# Patient Record
Sex: Female | Born: 1980 | Race: Black or African American | Hispanic: No | Marital: Married | Smoking: Never smoker
Health system: Southern US, Community
[De-identification: ages and names within clinical notes are randomized; demographics above are authoritative.]

## PROBLEM LIST (undated history)

## (undated) DIAGNOSIS — I10 Essential (primary) hypertension: Secondary | ICD-10-CM

## (undated) DIAGNOSIS — A6000 Herpesviral infection of urogenital system, unspecified: Secondary | ICD-10-CM

## (undated) DIAGNOSIS — G43909 Migraine, unspecified, not intractable, without status migrainosus: Secondary | ICD-10-CM

## (undated) HISTORY — DX: Migraine, unspecified, not intractable, without status migrainosus: G43.909

---

## 1998-07-16 ENCOUNTER — Emergency Department (HOSPITAL_COMMUNITY): Admission: EM | Admit: 1998-07-16 | Discharge: 1998-07-16 | Payer: Self-pay | Admitting: Emergency Medicine

## 1998-07-16 ENCOUNTER — Encounter: Payer: Self-pay | Admitting: Emergency Medicine

## 2000-09-18 ENCOUNTER — Emergency Department (HOSPITAL_COMMUNITY): Admission: EM | Admit: 2000-09-18 | Discharge: 2000-09-18 | Payer: Self-pay | Admitting: Emergency Medicine

## 2001-03-25 ENCOUNTER — Emergency Department (HOSPITAL_COMMUNITY): Admission: EM | Admit: 2001-03-25 | Discharge: 2001-03-25 | Payer: Self-pay

## 2001-03-27 ENCOUNTER — Emergency Department (HOSPITAL_COMMUNITY): Admission: EM | Admit: 2001-03-27 | Discharge: 2001-03-27 | Payer: Self-pay

## 2001-04-07 ENCOUNTER — Other Ambulatory Visit: Admission: RE | Admit: 2001-04-07 | Discharge: 2001-04-07 | Payer: Self-pay | Admitting: Gynecology

## 2004-06-13 ENCOUNTER — Emergency Department (HOSPITAL_COMMUNITY): Admission: EM | Admit: 2004-06-13 | Discharge: 2004-06-13 | Payer: Self-pay | Admitting: Emergency Medicine

## 2008-10-07 ENCOUNTER — Observation Stay: Payer: Self-pay | Admitting: Obstetrics and Gynecology

## 2008-10-24 ENCOUNTER — Observation Stay: Payer: Self-pay

## 2008-11-30 ENCOUNTER — Inpatient Hospital Stay: Payer: Self-pay

## 2012-11-11 ENCOUNTER — Emergency Department: Payer: Self-pay | Admitting: Emergency Medicine

## 2013-03-19 ENCOUNTER — Ambulatory Visit: Payer: Self-pay | Admitting: Obstetrics and Gynecology

## 2013-03-23 ENCOUNTER — Encounter: Payer: Self-pay | Admitting: *Deleted

## 2013-04-17 ENCOUNTER — Ambulatory Visit (INDEPENDENT_AMBULATORY_CARE_PROVIDER_SITE_OTHER): Payer: Federal, State, Local not specified - PPO | Admitting: General Surgery

## 2013-04-17 ENCOUNTER — Encounter: Payer: Self-pay | Admitting: General Surgery

## 2013-04-17 VITALS — BP 124/76 | HR 80 | Resp 12 | Ht 61.0 in | Wt 169.0 lb

## 2013-04-17 DIAGNOSIS — D1739 Benign lipomatous neoplasm of skin and subcutaneous tissue of other sites: Secondary | ICD-10-CM

## 2013-04-17 DIAGNOSIS — D1720 Benign lipomatous neoplasm of skin and subcutaneous tissue of unspecified limb: Secondary | ICD-10-CM

## 2013-04-17 NOTE — Progress Notes (Signed)
Patient ID: Whitney Carey, female   DOB: 1981-01-19, 32 y.o.   MRN: [id number]  Chief Complaint  Patient presents with  . Other    bilateral axillary masses    HPI Whitney Carey is a 32 y.o. female who presents for an evaluation of bilateral axillary masses. The patient states the one on the right has gotten larger within the last couple of months. It tender to the touch. She has had to change the type of bra she wears she that it doesn't get irritated. No problems with the left side.   HPI  Past Medical History  Diagnosis Date  . Migraine     History reviewed. No pertinent past surgical history.  Family History  Problem Relation Age of Onset  . Adopted: Yes    Social History History  Substance Use Topics  . Smoking status: Former Games developer  . Smokeless tobacco: Not on file  . Alcohol Use: Yes    No Known Allergies  Current Outpatient Prescriptions  Medication Sig Dispense Refill  . aspirin-acetaminophen-caffeine (EXCEDRIN MIGRAINE) 250-250-65 MG per tablet Take 2 tablets by mouth daily as needed for pain.      . naproxen sodium (ANAPROX) 220 MG tablet Take 440 mg by mouth daily as needed.       No current facility-administered medications for this visit.    Review of Systems Review of Systems  Constitutional: Negative.   Respiratory: Negative.   Cardiovascular: Negative.     Blood pressure 124/76, pulse 80, resp. rate 12, height 5\' 1"  (1.549 m), weight 169 lb (76.658 kg).  Physical Exam Physical Exam  Constitutional: She is oriented to person, place, and time. She appears well-developed and well-nourished.  Eyes: Conjunctivae are normal. No scleral icterus.  Neck: No thyromegaly present.  Cardiovascular: Normal rate, regular rhythm and normal heart sounds.   No murmur heard. Pulmonary/Chest: Effort normal and breath sounds normal.    Lymphadenopathy:    She has no cervical adenopathy.  Neurological: She is alert and oriented to person, place, and time.    Skin: Skin is warm and dry.    Data Reviewed  Ultrasound reviewed. No defined mass just subcutaneous tissue that is prominent.   Assessment    Excess skin subcutaneous fold right axilla-possible lipoma     Plan    Discussed surgical excision with patient and patient is agreeable.     Patient's surgery has been scheduled for 04-25-13 at [facility].   Marytza Grandpre G 04/17/2013, 7:17 PM

## 2013-04-17 NOTE — Patient Instructions (Addendum)
Discussed surgical excision with patient and patient is agreeable.    Patient's surgery has been scheduled for 04-25-13 at [facility].

## 2013-04-18 ENCOUNTER — Encounter: Payer: Self-pay | Admitting: *Deleted

## 2013-04-25 ENCOUNTER — Ambulatory Visit: Payer: Self-pay | Admitting: General Surgery

## 2013-04-25 DIAGNOSIS — D213 Benign neoplasm of connective and other soft tissue of thorax: Secondary | ICD-10-CM

## 2013-04-26 LAB — PATHOLOGY REPORT

## 2013-04-27 ENCOUNTER — Telehealth: Payer: Self-pay | Admitting: *Deleted

## 2013-04-27 NOTE — Telephone Encounter (Signed)
Phone call from patient states the Tramadol is making her itch.  Advised to stop taking it and use benadryl as needed. She may use tylenol or advil as needed for pain.

## 2013-04-30 ENCOUNTER — Encounter: Payer: Self-pay | Admitting: General Surgery

## 2013-04-30 ENCOUNTER — Telehealth: Payer: Self-pay

## 2013-04-30 ENCOUNTER — Ambulatory Visit (INDEPENDENT_AMBULATORY_CARE_PROVIDER_SITE_OTHER): Payer: Federal, State, Local not specified - PPO | Admitting: General Surgery

## 2013-04-30 VITALS — BP 120/70 | HR 76 | Resp 12 | Ht 61.0 in | Wt 169.0 lb

## 2013-04-30 DIAGNOSIS — D2360 Other benign neoplasm of skin of unspecified upper limb, including shoulder: Secondary | ICD-10-CM

## 2013-04-30 NOTE — Progress Notes (Deleted)
Patient ID: Whitney Carey, female   DOB: [birth year], 32 y.o.   MRN: [id number]  Chief Complaint  Patient presents with  . Follow-up    HPI Whitney Carey is a 32 y.o. female.  *** HPI  Past Medical History  Diagnosis Date  . Migraine     No past surgical history on file.  Family History  Problem Relation Age of Onset  . Adopted: Yes    Social History History  Substance Use Topics  . Smoking status: Former Games developer  . Smokeless tobacco: Not on file  . Alcohol Use: Yes    No Known Allergies  Current Outpatient Prescriptions  Medication Sig Dispense Refill  . aspirin-acetaminophen-caffeine (EXCEDRIN MIGRAINE) 250-250-65 MG per tablet Take 2 tablets by mouth daily as needed for pain.      . naproxen sodium (ANAPROX) 220 MG tablet Take 440 mg by mouth daily as needed.       No current facility-administered medications for this visit.    Review of Systems Review of Systems  Height 5\' 1"  (1.549 m), weight 169 lb (76.658 kg).  Physical Exam Physical Exam  Data Reviewed ***  Assessment    ***    Plan    ***       Sinda Du 04/30/2013, 10:24 AM

## 2013-04-30 NOTE — Progress Notes (Signed)
Patient ID: Whitney Carey, female   DOB: 10-19-1980, 32 y.o.   MRN: [id number] Patient here due to swelling and pain from removal of mass from the right axilla. The surgery was done on 04/25/13. She states that the area started swelling and becoming painful. She feels that the area is warm to the touch. She states that she has had no fevers, or drainage from the incision site. Exam showed a swelling consistent with seroma. No signs of infection. 75 cc serous fluid drained from site. After sterile prep 1 ml 1% xylocaine used and 18 g needle used to aspirate the seroma.

## 2013-04-30 NOTE — Telephone Encounter (Signed)
Patient called this morning stating that she started experiencing swelling around her incision site yesterday. She states that the swelling is severe and she cannot return to work today. She had a surgical excision preformed 04/25/13. I spoke with Dr Evette Cristal and he said to have her come into the office this morning to be seen. I instructed the patient to come in today to be seen and she agreed.

## 2013-04-30 NOTE — Patient Instructions (Addendum)
Patient to use heating pad.

## 2013-05-01 ENCOUNTER — Telehealth: Payer: Self-pay

## 2013-05-01 ENCOUNTER — Encounter: Payer: Self-pay | Admitting: General Surgery

## 2013-05-01 DIAGNOSIS — IMO0002 Reserved for concepts with insufficient information to code with codable children: Secondary | ICD-10-CM | POA: Insufficient documentation

## 2013-05-01 NOTE — Telephone Encounter (Signed)
Appointment made for am.

## 2013-05-01 NOTE — Telephone Encounter (Signed)
Patient called and states that her under arm has swollen up again and she would like to know what to do.

## 2013-05-02 ENCOUNTER — Encounter: Payer: Self-pay | Admitting: General Surgery

## 2013-05-02 ENCOUNTER — Ambulatory Visit: Payer: Federal, State, Local not specified - PPO | Admitting: General Surgery

## 2013-05-02 VITALS — BP 140/82 | HR 76 | Resp 14 | Ht 61.0 in | Wt 161.0 lb

## 2013-05-02 NOTE — Patient Instructions (Signed)
Patient to keep 05/10/13 appointment.

## 2013-05-02 NOTE — Progress Notes (Signed)
Patient here today for recurrence of seroma drained from rt axilla yesterday.  Swelling is just as big as it was yesterday. With consent a seroma cath was placed. After sterile prep, 3 ml 1%xylocaine mixed with 0.5% marcine was instilled in the inferior aspect. Small skin opening meade and seroma cath  Placed into the cavity.  This was anchored with 4-0 nylon to the skin and dressed. Over 90 ml fluid drained right away. Instructed on care of drain and keeping record of amount of drainage.

## 2013-05-08 ENCOUNTER — Ambulatory Visit: Payer: Federal, State, Local not specified - PPO | Admitting: *Deleted

## 2013-05-08 ENCOUNTER — Telehealth: Payer: Self-pay | Admitting: *Deleted

## 2013-05-08 NOTE — Progress Notes (Signed)
Patient here today for drain check. Dressing removed area clean.  Drain clotted flushed with 5 ml saline per Dr Evette Cristal. Redressed.

## 2013-05-08 NOTE — Telephone Encounter (Signed)
Left message for patient to call the office.   Patient had left a message with the answering service regarding a rash and states tube is loose. She can come in to see the nurse today if she wishes. Patient is presently scheduled to see Dr. Evette Cristal on Wednesday.

## 2013-05-08 NOTE — Patient Instructions (Signed)
May use hydrocortisone cream for local rash right upper arm

## 2013-05-09 ENCOUNTER — Other Ambulatory Visit: Payer: Self-pay | Admitting: *Deleted

## 2013-05-09 ENCOUNTER — Telehealth: Payer: Self-pay | Admitting: *Deleted

## 2013-05-09 MED ORDER — HYDROCODONE-ACETAMINOPHEN 5-325 MG PO TABS: 1.0000 | ORAL_TABLET | ORAL | Status: DC | PRN

## 2013-05-09 NOTE — Telephone Encounter (Signed)
Phone call from patient states she ios having increased pain since yesterday.  The drainage yesterday was 25ml.  She idn't sleep well last night fromt he pain.  "Aching" more. Using tylenol, aleve, and heating pad and its not helping. Informed I would have to talk to Dr Evette Cristal.

## 2013-05-09 NOTE — Progress Notes (Signed)
RX called in to [facility]

## 2013-05-09 NOTE — Telephone Encounter (Signed)
vicodin 5/325mg  1 q4h prn pain #20 per Dr Evette Cristal

## 2013-05-10 ENCOUNTER — Ambulatory Visit: Payer: Federal, State, Local not specified - PPO | Admitting: General Surgery

## 2013-05-10 ENCOUNTER — Encounter: Payer: Self-pay | Admitting: General Surgery

## 2013-05-10 VITALS — BP 138/88 | HR 64 | Resp 12 | Ht 61.0 in | Wt 167.0 lb

## 2013-05-10 MED ORDER — FLUCONAZOLE 150 MG PO TABS: 150.0000 mg | ORAL_TABLET | Freq: Once | ORAL | Status: DC

## 2013-05-10 MED ORDER — SULFAMETHOXAZOLE-TRIMETHOPRIM 800-160 MG PO TABS: 1.0000 | ORAL_TABLET | Freq: Two times a day (BID) | ORAL | Status: DC

## 2013-05-10 NOTE — Progress Notes (Signed)
Patient here today for follow up right drain. Drainage has been minimal over past several days. Pain improving on Vicodin.  13 ml withdrew from drain. Mild induration noted in anterior aspect of excision site.  Will give a weeks course of Septra DS. RTC next week

## 2013-05-15 ENCOUNTER — Encounter: Payer: Self-pay | Admitting: General Surgery

## 2013-05-15 ENCOUNTER — Ambulatory Visit: Payer: Federal, State, Local not specified - PPO | Admitting: General Surgery

## 2013-05-15 VITALS — BP 138/84 | HR 70 | Resp 12 | Ht 61.0 in | Wt 167.0 lb

## 2013-05-15 NOTE — Progress Notes (Signed)
Patient here today for follow up right drain.Patient states she is doing better.  Removed drain and removed 5 cc of fluid.Swelling has markedly improved and induration has subsided.

## 2013-05-15 NOTE — Patient Instructions (Addendum)
Patient to return in 2 week.

## 2013-05-28 ENCOUNTER — Ambulatory Visit: Payer: Federal, State, Local not specified - PPO | Admitting: General Surgery

## 2013-06-28 ENCOUNTER — Encounter: Payer: Self-pay | Admitting: *Deleted

## 2014-06-04 IMAGING — US US EXTREM NON VASC*R* COMPLETE
1 series · 14 of 25 positions shown · non-contrast
Comparison: none

REASON FOR EXAM: fatty tumor rt axilla
COMMENTS:

[Series 1: us extrem non vasc*right* complete · 0.08mm/px · 14 of 29 slices shown]
[im 1/29]
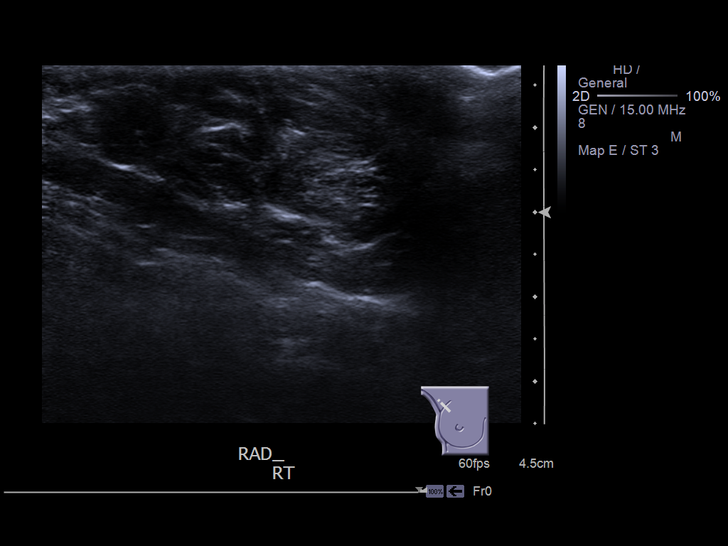
[im 3/29]
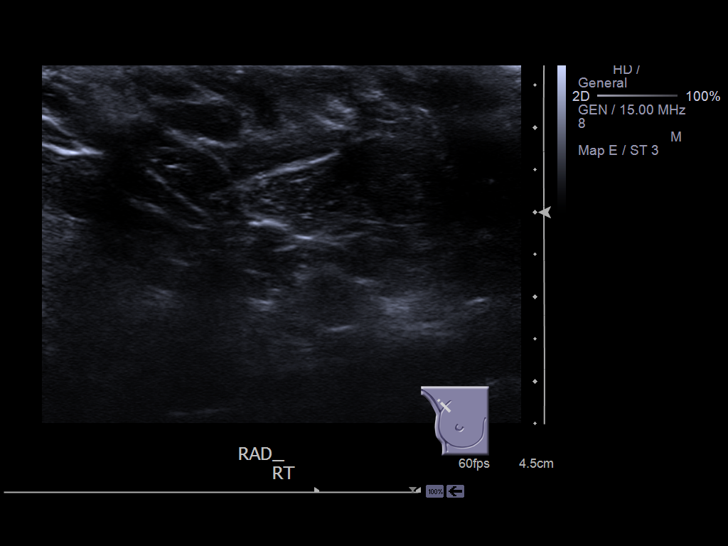
[im 5/29]
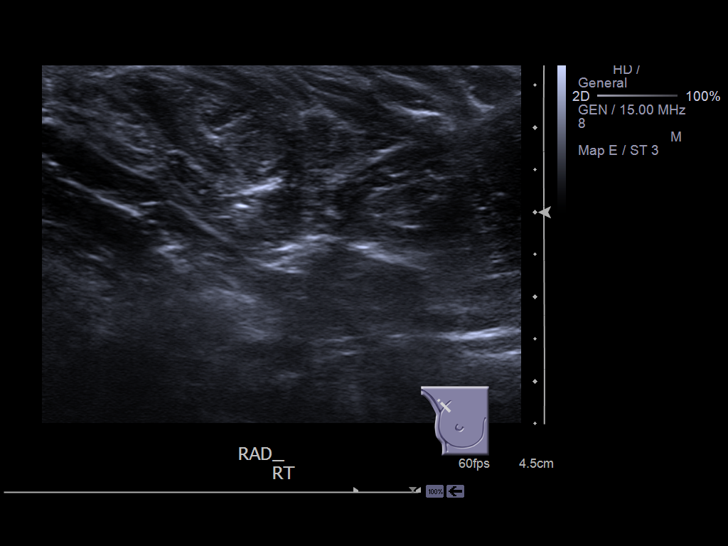
[im 8/29]
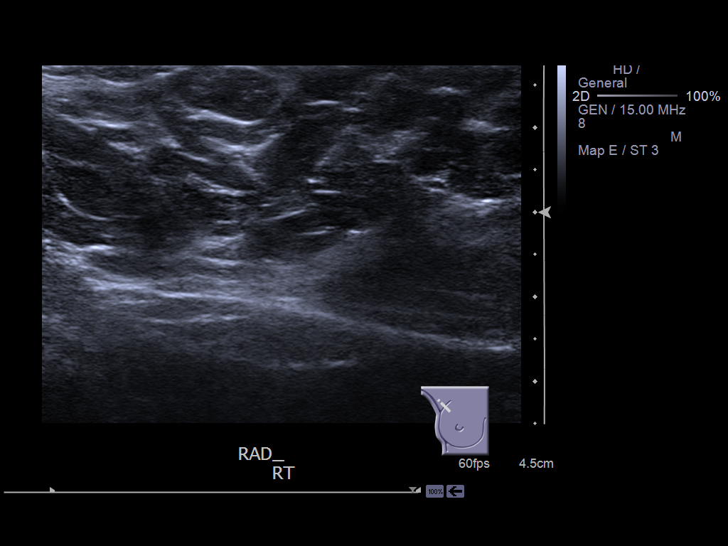
[im 10/29]
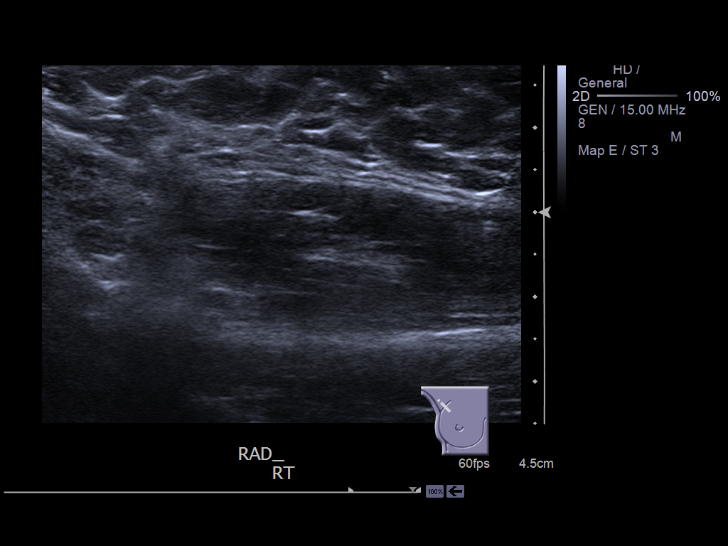
[im 11/29]
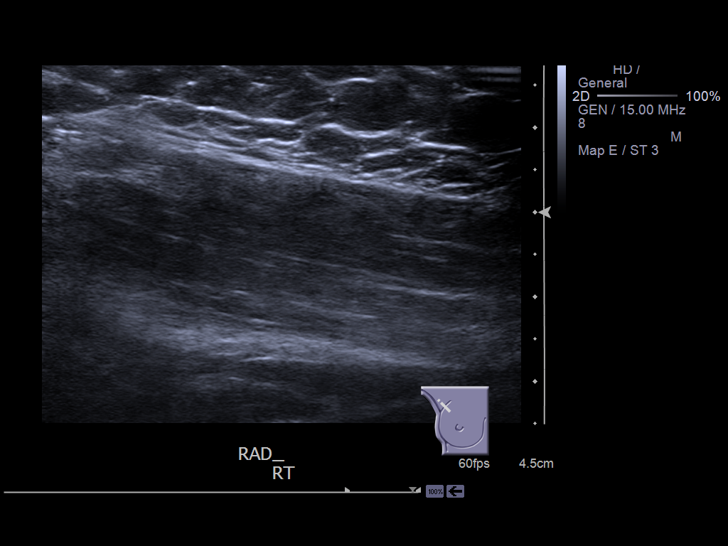
[im 13/29]
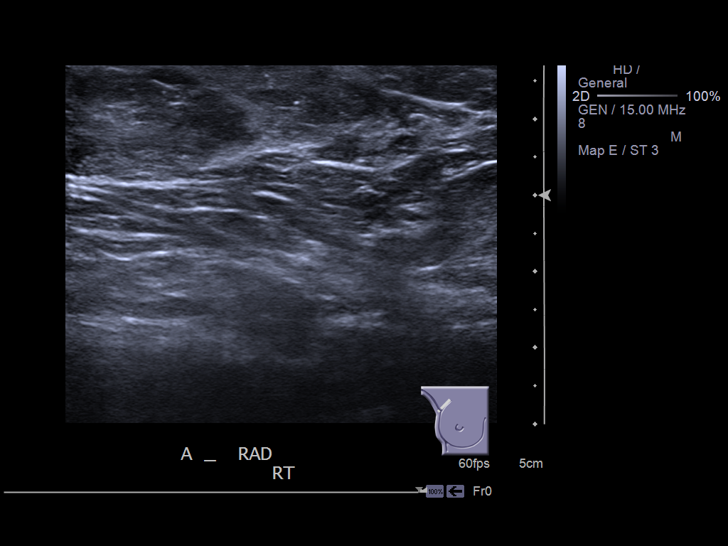
[im 16/29]
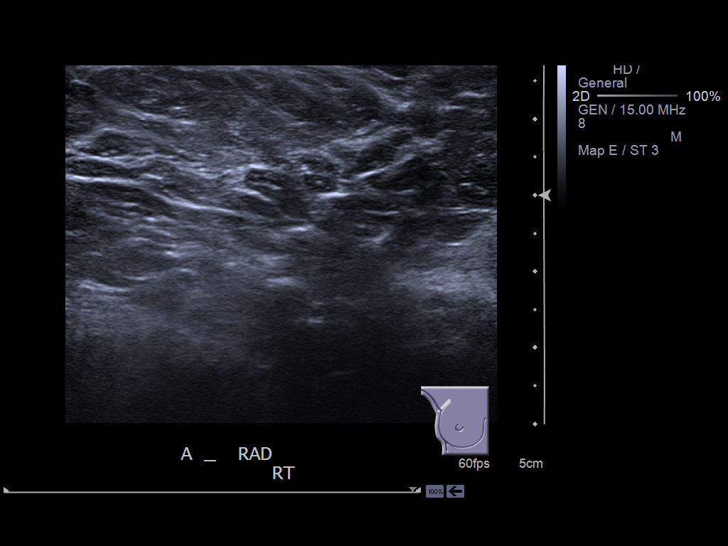
[im 18/29]
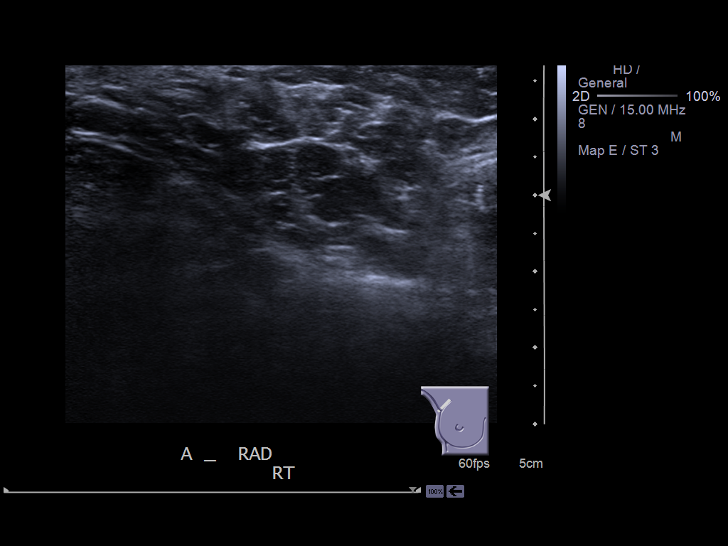
[im 19/29]
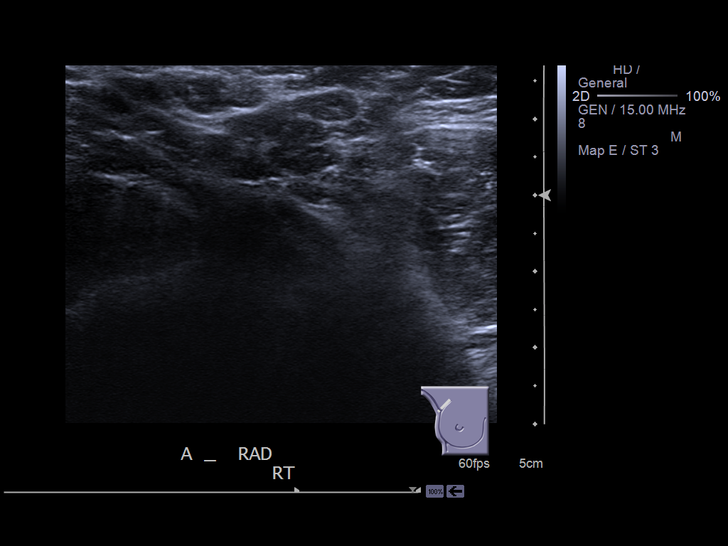
[im 22/29]
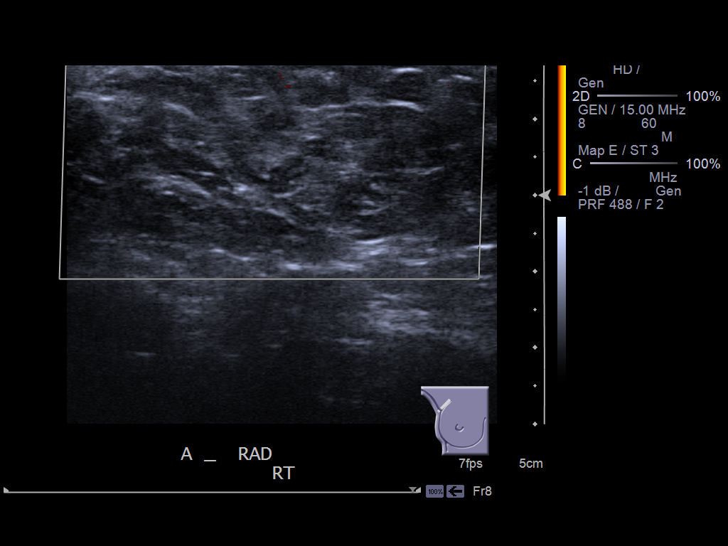
[im 24/29]
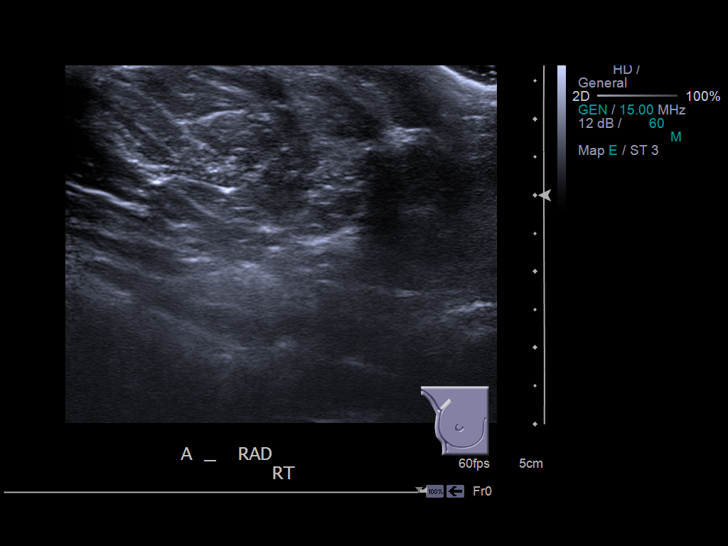
[im 26/29]
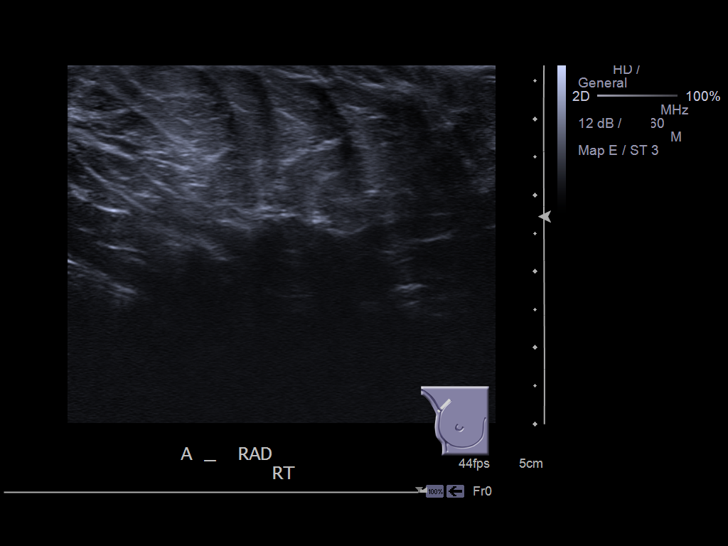
[im 29/29]
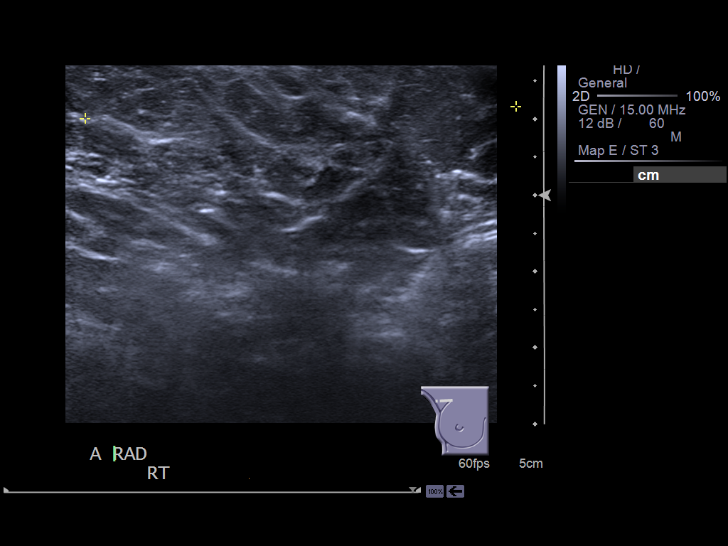

[14 of 25 positions shown; findings below may reference images not displayed]

PROCEDURE:     US  - US NON-VASCULAR EXTREMITY RT  - March 19, 2013 [DATE]

RESULT:     Dedicated ultrasound of the right axilla is performed. Some
comparison images were obtained through the left axilla. There is a similar
appearance of the echotexture and architecture sonographically in the
axillary regions. The right axillary appearance shows this area tissue is
somewhat thicker than on the left axilla. A well-circumscribed or
encapsulated mass is not appreciated. The possibility of a lipoma in a fatty
region such as the axilla would be difficult to completely exclude.
IMPRESSION: Slight thickening of the tissue the right axillary region
compare to the left without a discernible mass. Please see above.

[REDACTED]

## 2014-07-15 ENCOUNTER — Encounter: Payer: Self-pay | Admitting: General Surgery

## 2015-01-03 NOTE — Op Note (Signed)
PATIENT NAME:  Whitney Carey, Whitney Carey MR#:  [id number] DATE OF BIRTH:  Apr 09, 1981  DATE OF PROCEDURE:  04/25/2013  PREOPERATIVE DIAGNOSIS: Mass in the right axilla.   POSTOPERATIVE DIAGNOSIS: Mass in the right axilla, likely a lipoma.    OPERATION PERFORMED: Excision of soft tissue mass in the right axilla.   SURGEON: S.G. Jamal Collin, M.D.   ANESTHESIA: General.   COMPLICATIONS: None.   ESTIMATED BLOOD LOSS: Minimal.   DRAINS: None.   DESCRIPTION OF PROCEDURE: The patient was put to sleep with LMA, and then the right axilla was prepped and draped out as a sterile field. Timeout procedure was performed. Located in the lower medial portion of the axilla just below the anterior fold was a soft mass approximately 6 to 7 cm in size. It did not have any defined borders on palpation. A transverse incision over this was planned, and 0.5% Marcaine totaling 10 mL was instilled. An incision was then made in the subcutaneous tissue. A lipomatous mass was encountered which was then circumferentially freed and excised out using cautery for control of bleeding. After this was successfully removed, the area was inspected and after ensuring hemostasis was irrigated and an additional 5 mL of Marcaine was placed in the cavity for postop analgesia. Subcutaneous tissue was closed with interrupted 2-0 Vicryl stitches, and the skin was closed with subcuticular 4-0 Vicryl, covered with Dermabond. The procedure was well tolerated, and the patient subsequently was extubated and returned to the recovery room in stable condition.   ____________________________ S.Robinette Haines, MD sgs:gb D: 04/25/2013 17:12:57 ET T: 04/25/2013 23:44:34 ET JOB#: [id number]  cc: Synthia Innocent. Jamal Collin, MD, <Dictator> [facility] Robinette Haines MD ELECTRONICALLY SIGNED 04/26/2013 6:56

## 2016-10-08 ENCOUNTER — Emergency Department
Admission: EM | Admit: 2016-10-08 | Discharge: 2016-10-08 | Disposition: A | Discharge status: Home / Self Care | Payer: Federal, State, Local not specified - PPO | Attending: Emergency Medicine | Admitting: Emergency Medicine

## 2016-10-08 ENCOUNTER — Encounter: Payer: Self-pay | Admitting: Emergency Medicine

## 2016-10-08 DIAGNOSIS — R0981 Nasal congestion: Secondary | ICD-10-CM | POA: Diagnosis not present

## 2016-10-08 DIAGNOSIS — Z791 Long term (current) use of non-steroidal anti-inflammatories (NSAID): Secondary | ICD-10-CM | POA: Insufficient documentation

## 2016-10-08 DIAGNOSIS — R05 Cough: Secondary | ICD-10-CM | POA: Insufficient documentation

## 2016-10-08 DIAGNOSIS — Z7982 Long term (current) use of aspirin: Secondary | ICD-10-CM | POA: Diagnosis not present

## 2016-10-08 DIAGNOSIS — Z79899 Other long term (current) drug therapy: Secondary | ICD-10-CM | POA: Insufficient documentation

## 2016-10-08 DIAGNOSIS — R509 Fever, unspecified: Secondary | ICD-10-CM | POA: Diagnosis present

## 2016-10-08 DIAGNOSIS — R52 Pain, unspecified: Secondary | ICD-10-CM | POA: Diagnosis not present

## 2016-10-08 DIAGNOSIS — I10 Essential (primary) hypertension: Secondary | ICD-10-CM | POA: Diagnosis not present

## 2016-10-08 DIAGNOSIS — R69 Illness, unspecified: Secondary | ICD-10-CM

## 2016-10-08 DIAGNOSIS — J111 Influenza due to unidentified influenza virus with other respiratory manifestations: Secondary | ICD-10-CM

## 2016-10-08 HISTORY — DX: Essential (primary) hypertension: I10

## 2016-10-08 LAB — INFLUENZA PANEL BY PCR (TYPE A & B)
Influenza A By PCR: NEGATIVE
Influenza B By PCR: NEGATIVE

## 2016-10-08 MED ORDER — PSEUDOEPH-BROMPHEN-DM 30-2-10 MG/5ML PO SYRP: 5.0000 mL | ORAL_SOLUTION | Freq: Four times a day (QID) | ORAL | 0 refills | Status: DC | PRN

## 2016-10-08 MED ORDER — FLUTICASONE PROPIONATE 50 MCG/ACT NA SUSP: 2.0000 | Freq: Every day | NASAL | 0 refills | Status: DC

## 2016-10-08 MED ORDER — ACETAMINOPHEN-CODEINE #3 300-30 MG PO TABS: 1.0000 | ORAL_TABLET | Freq: Three times a day (TID) | ORAL | 0 refills | Status: DC | PRN

## 2016-10-08 MED ORDER — BENZONATATE 100 MG PO CAPS: 100.0000 mg | ORAL_CAPSULE | Freq: Three times a day (TID) | ORAL | 0 refills | Status: DC | PRN

## 2016-10-08 NOTE — ED Provider Notes (Signed)
[facility] Emergency Department Provider Note ____________________________________________  Time seen: 2213  I have reviewed the triage vital signs and the nursing notes.  HISTORY  Chief Complaint  Influenza  HPI Whitney Carey is a 36 y.o. female presents to the ED with a 3 day complaint of flu-like symptoms that include fevers, sweats, cough, congestion, and bodyaches. She has been dosing over-the-counter TheraFlu for symptom relief. She did not receive the seasonal flu vaccine.  Past Medical History:  Diagnosis Date  . Hypertension   . Migraine     Patient Active Problem List   Diagnosis Date Noted  . Seroma complicating a procedure 05/01/2013  . 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 No pertinent surgical history.  Prior to Admission medications   Medication Sig Start Date End Date Taking? Authorizing Provider  acetaminophen-codeine (TYLENOL #3) 300-30 MG tablet Take 1 tablet by mouth every 8 (eight) hours as needed for moderate pain. 10/08/16   Talibah Colasurdo V Bacon Vashaun Osmon, PA-C  aspirin-acetaminophen-caffeine (EXCEDRIN MIGRAINE) [phone] MG per tablet Take 2 tablets by mouth daily as needed for pain.    Historical Provider, MD  benzonatate (TESSALON PERLES) 100 MG capsule Take 1 capsule (100 mg total) by mouth 3 (three) times daily as needed for cough (Take 1-2 per dose). 10/08/16   Lon Klippel V Bacon Cylas Falzone, PA-C  brompheniramine-pseudoephedrine-DM 30-2-10 MG/5ML syrup Take 5 mLs by mouth 4 (four) times daily as needed. 10/08/16   Lemar Bakos V Bacon Brennan Litzinger, PA-C  fluconazole (DIFLUCAN) 150 MG tablet Take 1 tablet (150 mg total) by mouth once. 05/10/13   Seeplaputhur Robinette Haines, MD  fluticasone (FLONASE) 50 MCG/ACT nasal spray Place 2 sprays into both nostrils daily. 10/08/16   Coralie Stanke V Bacon Nichael Ehly, PA-C  HYDROcodone-acetaminophen (NORCO/VICODIN) 5-325 MG per tablet Take 1 tablet by mouth every 4 (four) hours as needed for pain. 05/09/13   Seeplaputhur Robinette Haines, MD  naproxen sodium (ANAPROX) 220 MG tablet Take 440 mg by mouth daily as needed.    Historical Provider, MD  sulfamethoxazole-trimethoprim (BACTRIM DS,SEPTRA DS) 800-160 MG per tablet Take 1 tablet by mouth 2 (two) times daily. 05/10/13   Seeplaputhur Robinette Haines, MD    Allergies Tramadol  Family History  Problem Relation Age of Onset  . Adopted: Yes    Social History Social History  Substance Use Topics  . Smoking status: Never Smoker  . Smokeless tobacco: Never Used  . Alcohol use Yes    Review of Systems  Constitutional: Positive for fever. Eyes: Negative for visual changes. ENT: Negative for sore throat. Cardiovascular: Negative for chest pain. Respiratory: Negative for shortness of breath. Gastrointestinal: Negative for abdominal pain, vomiting and diarrhea. Musculoskeletal: Negative for back pain. Reports general bodyaches Skin: Negative for rash. Neurological: Negative for headaches, focal weakness or numbness. ____________________________________________  PHYSICAL EXAM:  VITAL SIGNS: ED Triage Vitals  Enc Vitals Group     BP 10/08/16 2116 (!) 170/100     Pulse Rate 10/08/16 2116 80     Resp 10/08/16 2116 16     Temp 10/08/16 2116 98.2 F (36.8 C)     Temp Source 10/08/16 2116 Oral     SpO2 10/08/16 2116 95 %     Weight 10/08/16 2118 165 lb (74.8 kg)     Height 10/08/16 2118 5\' 2"  (1.575 m)     Head Circumference --      Peak Flow --      Pain Score 10/08/16 2118 5     Pain  Loc --      Pain Edu? --      Excl. in [place]? --    Constitutional: Alert and oriented. Well appearing and in no distress. Head: Normocephalic and atraumatic. Eyes: Conjunctivae are normal. PERRL. Normal extraocular movements Ears: Canals clear. TMs intact bilaterally. Nose: No congestion/rhinorrhea/epistaxis. Mouth/Throat: Mucous membranes are moist. Cardiovascular: Normal rate, regular rhythm. Normal distal pulses. Respiratory: Normal respiratory effort. No  wheezes/rales/rhonchi. Musculoskeletal: Nontender with normal range of motion in all extremities.  Neurologic:  Normal gait without ataxia. Normal speech and language. No gross focal neurologic deficits are appreciated. Skin:  Skin is warm, dry and intact. No rash noted. ____________________________________________   LABS (pertinent positives/negatives) Labs Reviewed  INFLUENZA PANEL BY PCR (TYPE A & B)  ____________________________________________  INITIAL IMPRESSION / ASSESSMENT AND PLAN / ED COURSE  Patient with symptoms consistent with influenza despite a negative influenza test. She is discharged with prescriptions for Tessalon Perles, Flonase, Bromfed DM syrup, and Tylenol #3 dosed as directed. He is encouraged to use over-the-counter Delsym cough syrup and a monitor and treat fevers as appropriate. Work note is provided for 1-2 days as needed. ____________________________________________  FINAL CLINICAL IMPRESSION(S) / ED DIAGNOSES  Final diagnoses:  Influenza-like illness      Melvenia Needles, PA-C 10/08/16 [place], MD 10/08/16 2316

## 2016-10-08 NOTE — ED Triage Notes (Signed)
Pt states that x3 days she has been feeling generalized body aches and diarrhea x3  Today. Pt states that she has been febrile during this time. Pt is ambulatory to triage with NAD at this time.

## 2016-10-08 NOTE — Discharge Instructions (Signed)
Your symptoms are consistent with the flu despite your negative Flu test. Take the prescription meds as directed. Consider using a room humidifier overnight. Start OTC Delsym (or store brand equivalent) for cough relief. Follow-up with your provider or [facility] as needed.

## 2016-10-08 NOTE — ED Notes (Signed)
Patient c/o chills, generalized body aches, diarrhea, anorexia, sinus congestion beginning 1/24. Patient denies N/V, cough, SOB

## 2016-11-26 ENCOUNTER — Encounter: Payer: Self-pay | Admitting: Emergency Medicine

## 2016-11-26 ENCOUNTER — Emergency Department
Admission: EM | Admit: 2016-11-26 | Discharge: 2016-11-26 | Disposition: A | Discharge status: Home / Self Care | Payer: Federal, State, Local not specified - PPO | Attending: Emergency Medicine | Admitting: Emergency Medicine

## 2016-11-26 DIAGNOSIS — Y9389 Activity, other specified: Secondary | ICD-10-CM | POA: Diagnosis not present

## 2016-11-26 DIAGNOSIS — I10 Essential (primary) hypertension: Secondary | ICD-10-CM | POA: Insufficient documentation

## 2016-11-26 DIAGNOSIS — Z79899 Other long term (current) drug therapy: Secondary | ICD-10-CM | POA: Insufficient documentation

## 2016-11-26 DIAGNOSIS — S29019A Strain of muscle and tendon of unspecified wall of thorax, initial encounter: Secondary | ICD-10-CM | POA: Diagnosis not present

## 2016-11-26 DIAGNOSIS — S199XXA Unspecified injury of neck, initial encounter: Secondary | ICD-10-CM | POA: Diagnosis present

## 2016-11-26 DIAGNOSIS — Y999 Unspecified external cause status: Secondary | ICD-10-CM | POA: Insufficient documentation

## 2016-11-26 DIAGNOSIS — Y92410 Unspecified street and highway as the place of occurrence of the external cause: Secondary | ICD-10-CM | POA: Diagnosis not present

## 2016-11-26 DIAGNOSIS — S161XXA Strain of muscle, fascia and tendon at neck level, initial encounter: Secondary | ICD-10-CM | POA: Insufficient documentation

## 2016-11-26 DIAGNOSIS — M7918 Myalgia, other site: Secondary | ICD-10-CM

## 2016-11-26 MED ORDER — IBUPROFEN 600 MG PO TABS: 600.0000 mg | ORAL_TABLET | Freq: Three times a day (TID) | ORAL | 0 refills | Status: DC | PRN

## 2016-11-26 MED ORDER — OXYCODONE-ACETAMINOPHEN 5-325 MG PO TABS: 1.0000 | ORAL_TABLET | Freq: Four times a day (QID) | ORAL | 0 refills | Status: DC | PRN

## 2016-11-26 MED ORDER — CYCLOBENZAPRINE HCL 10 MG PO TABS: 10.0000 mg | ORAL_TABLET | Freq: Three times a day (TID) | ORAL | 0 refills | Status: DC | PRN
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
## 2019-07-15 ENCOUNTER — Ambulatory Visit
Admission: EM | Admit: 2019-07-15 | Discharge: 2019-07-15 | Disposition: A | Discharge status: Home / Self Care | Payer: Federal, State, Local not specified - PPO

## 2019-07-15 ENCOUNTER — Encounter: Payer: Self-pay | Admitting: Emergency Medicine

## 2019-07-15 ENCOUNTER — Other Ambulatory Visit: Payer: Self-pay

## 2019-07-15 DIAGNOSIS — Z20828 Contact with and (suspected) exposure to other viral communicable diseases: Secondary | ICD-10-CM

## 2019-07-15 DIAGNOSIS — J069 Acute upper respiratory infection, unspecified: Secondary | ICD-10-CM

## 2019-07-15 DIAGNOSIS — H66002 Acute suppurative otitis media without spontaneous rupture of ear drum, left ear: Secondary | ICD-10-CM | POA: Diagnosis not present

## 2019-07-15 DIAGNOSIS — Z3A36 36 weeks gestation of pregnancy: Secondary | ICD-10-CM

## 2019-07-15 DIAGNOSIS — Z20822 Contact with and (suspected) exposure to covid-19: Secondary | ICD-10-CM

## 2019-07-15 HISTORY — DX: Herpesviral infection of urogenital system, unspecified: A60.00

## 2019-07-15 MED ORDER — AMOXICILLIN 875 MG PO TABS: 875.0000 mg | ORAL_TABLET | Freq: Two times a day (BID) | ORAL | 0 refills | Status: AC

## 2019-07-15 NOTE — ED Provider Notes (Signed)
[place], [place]   Name: Whitney Carey DOB: [birth year] MRN: [id number] CSN: [id number] PCP: System, Pcp Not In  Arrival date and time:  07/15/19 1158  Chief Complaint:  Cough and Nasal Congestion   NOTE: Prior to seeing the patient today, I have reviewed the triage nursing documentation and vital signs. Clinical staff has updated patient's PMH/PSHx, current medication list, and drug allergies/intolerances to ensure comprehensive history available to assist in medical decision making.   History:   HPI: Whitney Carey is a 38 y.o. female who presents today with complaints of cough, congestion, headache, sore throat, and BILATERAL ear pain that started with acute onset on Friday. She endorses subjective fevers. Cough has been productive of thick clear/white sputum. PMH (+) for seasonal allergies, however patient notes that her current symptoms are worse than any allergy symptoms that she has ever experienced. She denies that she has experienced any nausea, vomiting, diarrhea, or abdominal pain. She is eating and drinking well. Patient denies any perceived alterations to her sense of taste or smell. She notes that her children were sick with similar symptoms last week; resolved after a couple of days without treatment. Patient denies being in close contact with anyone else known to be ill. She has never been tested for SARS-CoV-2 (novel coronavirus) per her report. She presents today with concerns about possible SARS-CoV-2 infection as she is currently [redacted] weeks pregnant. She is a high risk (advance maternal age, essential HTN, gestational diabetes) G4P3 who followed at [facility]. Next OB/GYN visit is on Tuesday (06/16/2019). Despite her symptoms, patient has not taken any over the counter interventions to help improve/relieve her reported symptoms at home due to her pregnancy status. She has been drinking hot tea with honey and lemon, which has helped "some".  Past Medical History:  Diagnosis Date  . Genital  herpes simplex   . Hypertension   . Migraine     History reviewed. No pertinent surgical history.  Family History  Adopted: Yes  Problem Relation Age of Onset  . Healthy Mother     Social History   Tobacco Use  . Smoking status: Never Smoker  . Smokeless tobacco: Never Used  Substance Use Topics  . Alcohol use: Yes  . Drug use: No    Patient Active Problem List   Diagnosis Date Noted  . Seroma complicating a procedure 05/01/2013  . Lipoma of axilla 04/17/2013    Home Medications:    Current Meds  Medication Sig  . Prenatal Vit-Fe Fumarate-FA (PNV PRENATAL PLUS MULTIVITAMIN) 27-1 MG TABS Take by mouth.    Allergies:   Tramadol  Review of Systems (ROS): Review of Systems  Constitutional: Positive for fever (subjective). Negative for fatigue.       36 week G4P3 followed at [facility].  HENT: Positive for congestion, ear pain and sore throat. Negative for postnasal drip, rhinorrhea, sinus pressure, sinus pain and sneezing.   Eyes: Negative for pain, discharge and redness.  Respiratory: Positive for cough. Negative for chest tightness and shortness of breath.   Cardiovascular: Negative for chest pain and palpitations.  Gastrointestinal: Negative for abdominal pain, diarrhea, nausea and vomiting.  Endocrine:       Gestational diabetes  Musculoskeletal: Negative for arthralgias, back pain, myalgias and neck pain.  Skin: Negative for color change, pallor and rash.  Allergic/Immunologic: Positive for environmental allergies (seasonal).  Neurological: Positive for headaches. Negative for dizziness, syncope and weakness.  Hematological: Negative for adenopathy.     Vital Signs: Today's Vitals   07/15/19  1212 07/15/19 1216 07/15/19 1248  BP:  (!) 147/99   Pulse:  97   Resp:  14   Temp:  98.9 F (37.2 C)   TempSrc:  Oral   SpO2:  97%   Weight: 187 lb (84.8 kg)    Height: 5\' 2"  (1.575 m)    PainSc: 0-No pain  0-No pain    Physical Exam: Physical Exam   Constitutional: She is oriented to person, place, and time and well-developed, well-nourished, and in no distress. She appears not dehydrated.  Non-toxic appearance. No distress.  HENT:  Head: Normocephalic and atraumatic.  Right Ear: Tympanic membrane normal.  Left Ear: There is tenderness. Tympanic membrane is erythematous and bulging. A middle ear effusion (suppurative) is present.  Nose: Mucosal edema and rhinorrhea present. No sinus tenderness.  Mouth/Throat: Uvula is midline and mucous membranes are normal. Posterior oropharyngeal erythema (+) clear PND present.  Eyes: Pupils are equal, round, and reactive to light. Conjunctivae and EOM are normal.  Neck: Normal range of motion and full passive range of motion without pain. Neck supple.  Cardiovascular: Normal rate, regular rhythm, normal heart sounds and intact distal pulses. Exam reveals no gallop and no friction rub.  No murmur heard. Pulmonary/Chest: Effort normal. No respiratory distress. She has no decreased breath sounds. She has no wheezes. She has rhonchi (upper airways; clears completely with cough). She has no rales.  Abdominal: Soft. Normal appearance and bowel sounds are normal. She exhibits no distension. There is no abdominal tenderness.  (+) gravid abdomen; no pain. (+) fetal movement appreciated by patient; activity at baseline. FHTs 150.   Musculoskeletal: Normal range of motion.  Lymphadenopathy:       Head (left side): Submandibular adenopathy present.  Neurological: She is alert and oriented to person, place, and time. Gait normal.  Skin: Skin is warm and dry. No rash noted. She is not diaphoretic.  Psychiatric: Mood, memory, affect and judgment normal.  Nursing note and vitals reviewed.   Urgent Care Treatments / Results:   LABS: PLEASE NOTE: all labs that were ordered this encounter are listed, however only abnormal results are displayed. Labs Reviewed  NOVEL CORONAVIRUS, NAA (HOSP ORDER, SEND-OUT TO REF LAB;  TAT 18-24 HRS)    EKG: -None  RADIOLOGY: No results found.  PROCEDURES: Procedures  MEDICATIONS RECEIVED THIS VISIT: Medications - No data to display  PERTINENT CLINICAL COURSE NOTES/UPDATES:   Initial Impression / Assessment and Plan / Urgent Care Course:  Pertinent labs & imaging results that were available during my care of the patient were personally reviewed by me and considered in my medical decision making (see lab/imaging section of note for values and interpretations).  Whitney Carey is a 38 y.o. female who presents to [facility] today with complaints of Cough and Nasal Congestion   Patient overall well appearing and in no acute distress today in clinic. Presenting symptoms (see HPI) and exam as documented above. She presents with symptoms associated with SARS-CoV-2 (novel coronavirus). Discussed typical symptom constellation. Reviewed potential for infection and need for testing. Patient amenable to being tested. SARS-CoV-2 swab collected by certified clinical staff. Discussed variable turn around times associated with testing, as swabs are being processed at [facility], and have been taking between 2-5 days to come back. She was advised to self quarantine, per [facility] DHHS guidelines, until negative results received.   Patient [redacted] weeks pregnant and has not taken any medications for her symptoms. She denies abdominal pain and confirms (+) fetal movement;  FHTs 150. LEFT ear appears infected. She has concurrent cough and congestion.  Until ruled out with confirmatory lab testing, SARS-CoV-2 remains part of the differential. Her testing is pending at this time.    Will proceed with treatment for AOME in the LEFT ear with a 7 day course of amoxicillin.   Discussed supportive care measures at home during acute phase of illness. Patient to rest as much as possible. She was encouraged to ensure adequate hydration (water and ORS) to prevent dehydration and electrolyte derangements.  Patient may use APAP on an as needed basis for pain/fever.    She is having concurrent URI symptoms. Patient advised that she is safe to use Coricidin HBP products and Delsym if really needed for severe symptoms, however encouraged her avoid overuse. Specifically cautioned her with regards to use of any decongestants. She was advised to avoid decongestants as they can all cause dangerous elevations in her blood pressure.    Patient is scheduled to see OB/GYN on 07/17/2019 for routine follow up visit. She was encouraged to solicit further recommendations from them regarding symptomatic relief if not improving.   I have reviewed the follow up and strict return precautions for any new or worsening symptoms. Patient is aware of symptoms that would be deemed urgent/emergent, and would thus require further evaluation either here or in the emergency department. At the time of discharge, she verbalized understanding and consent with the discharge plan as it was reviewed with her. All questions were fielded by provider and/or clinic staff prior to patient discharge.    Final Clinical Impressions / Urgent Care Diagnoses:   Final diagnoses:  Non-recurrent acute suppurative otitis media of left ear without spontaneous rupture of tympanic membrane  Acute upper respiratory infection  [redacted] weeks gestation of pregnancy  Encounter for laboratory testing for COVID-19 virus    New Prescriptions:  [place] Controlled Substance Registry consulted? Not Applicable  Meds ordered this encounter  Medications  . amoxicillin (AMOXIL) 875 MG tablet    Sig: Take 1 tablet (875 mg total) by mouth 2 (two) times daily for 7 days.    Dispense:  14 tablet    Refill:  0    Recommended Follow up Care:  Patient encouraged to follow up with the following provider within the specified time frame, or sooner as dictated by the severity of her symptoms. As always, she was instructed that for any urgent/emergent care needs, she should  seek care either here or in the emergency department for more immediate evaluation.  Follow-up Information    Your OB On 07/17/2019.   Why: As already scheduled        NOTE: This note was prepared using Lobbyist along with smaller Company secretary. Despite my best ability to proofread, there is the potential that transcriptional errors may still occur from this process, and are completely unintentional.    Karen Kitchens, NP 07/16/19 2325

## 2019-07-15 NOTE — ED Triage Notes (Signed)
Patient c/o cough, congestion and stuffy nose that started on Friday.  Patient unsure of fevers.

## 2019-07-15 NOTE — Discharge Instructions (Addendum)
It was very nice seeing you today in clinic. Thank you for entrusting me with your care.   Rest and Increase fluid intake as much as possible. Water is always best. May use over the counter Coricidin HBP products for your congestion or Delsym for cough.   You were tested for SARS-CoV-2 (novel coronavirus) today. Testing is performed by an outside lab ([facility]) and has variable turn around times ranging between 2-5 days. Current recommendations from the the CDC and [place] DHHS require that you remain out of work in order to quarantine at home until negative test results are have been received. In the event that your test results are positive, you will be contacted with further directives. These measures are being implemented out of an abundance of caution to prevent transmission and spread during the current SARS-CoV-2 pandemic.  Make arrangements to follow up with your regular doctor in 1 week for re-evaluation if not improving. If your symptoms/condition worsens, please seek follow up care either here or in the ER. Please remember, our [place] providers are "right here with you" when you need [place].   Again, it was my pleasure to take care of you today. Thank you for choosing our clinic. I hope that you start to feel better quickly.   Honor Loh, MSN, APRN, FNP-C, CEN Advanced Practice Provider [facility]

## 2019-07-16 ENCOUNTER — Encounter: Payer: Self-pay | Admitting: Urgent Care

## 2019-07-16 LAB — NOVEL CORONAVIRUS, NAA (HOSP ORDER, SEND-OUT TO REF LAB; TAT 18-24 HRS): SARS-CoV-2, NAA: NOT DETECTED

## 2021-05-14 ENCOUNTER — Ambulatory Visit
Admission: EM | Admit: 2021-05-14 | Discharge: 2021-05-14 | Disposition: A | Discharge status: Home / Self Care | Payer: Federal, State, Local not specified - PPO | Attending: Emergency Medicine | Admitting: Emergency Medicine

## 2021-05-14 ENCOUNTER — Encounter: Payer: Self-pay | Admitting: Emergency Medicine

## 2021-05-14 ENCOUNTER — Other Ambulatory Visit: Payer: Self-pay

## 2021-05-14 DIAGNOSIS — Z20822 Contact with and (suspected) exposure to covid-19: Secondary | ICD-10-CM | POA: Insufficient documentation

## 2021-05-14 DIAGNOSIS — J069 Acute upper respiratory infection, unspecified: Secondary | ICD-10-CM | POA: Diagnosis not present

## 2021-05-14 LAB — SARS CORONAVIRUS 2 (TAT 6-24 HRS): SARS Coronavirus 2: NEGATIVE

## 2021-05-14 MED ORDER — BENZONATATE 100 MG PO CAPS: 200.0000 mg | ORAL_CAPSULE | Freq: Three times a day (TID) | ORAL | 0 refills | Status: DC

## 2021-05-14 MED ORDER — PROMETHAZINE-DM 6.25-15 MG/5ML PO SYRP: 5.0000 mL | ORAL_SOLUTION | Freq: Four times a day (QID) | ORAL | 0 refills | Status: DC | PRN

## 2021-05-14 MED ORDER — IPRATROPIUM BROMIDE 0.06 % NA SOLN
2.0000 | Freq: Four times a day (QID) | NASAL | 12 refills | Status: DC
Start: 2021-05-14 — End: 2022-01-09

## 2021-05-14 NOTE — ED Triage Notes (Signed)
Pt c/o nasal congestion, sneezing, sore throat, cough, bloody sputum. Started about 3 days ago. She had a covid test at [facility] and was negative. Denies fever.

## 2021-05-14 NOTE — Discharge Instructions (Addendum)
Isolate at home pending the results of your COVID test.  If you test positive then you will have to quarantine for 5 days from the start of your symptoms.  After 5 days you can break quarantine if your symptoms have improved and you have not had a fever for 24 hours without taking Tylenol or ibuprofen.  Use over-the-counter Tylenol and ibuprofen as needed for body aches and fever.  Use the Tessalon Perles during the day as needed for cough and the Promethazine DM cough syrup at nighttime as will make you drowsy.  Use the Atrovent nasal spray, 2 squirts in each nostril every 6 hours, as needed for nasal congestion and postnasal drip.   If you develop any increased shortness of breath-especially at rest, you are unable to speak in full sentences, or is a late sign your lips are turning blue you need to go the ER for evaluation.

## 2021-05-14 NOTE — ED Provider Notes (Signed)
[facility]    CSN: [id number] Arrival date & time: 05/14/21  1110      History   Chief Complaint Chief Complaint  Patient presents with   Nasal Congestion    HPI Whitney Carey is a 40 y.o. female.   HPI  40 year old female here for evaluation of respiratory complaints.  Patient reports that she has been experiencing nasal congestion with yellow nasal discharge, sneezing, sore throat that hurts to swallow, cough that is productive for yellow sputum with some streaks of blood in it, and ear pain.  The symptoms began 3 days ago.  She denies any fever, body aches, shortness of breath or wheezing, or GI complaints.  She is unaware of any sick contacts.  Past Medical History:  Diagnosis Date   Genital herpes simplex    Hypertension    Migraine     Patient Active Problem List   Diagnosis Date Noted   Seroma complicating a procedure 05/01/2013   Lipoma of axilla 04/17/2013    History reviewed. No pertinent surgical history.  OB History     Gravida  4   Para  3   Term      Preterm      AB      Living  3      SAB      IAB      Ectopic      Multiple      Live Births           Obstetric Comments  1st Menstrual Cycle:  12  1st Pregnancy:  14          Home Medications    Prior to Admission medications   Medication Sig Start Date End Date Taking? Authorizing Provider  benzonatate (TESSALON) 100 MG capsule Take 2 capsules (200 mg total) by mouth every 8 (eight) hours. 05/14/21  Yes Margarette Canada, NP  hydrochlorothiazide (HYDRODIURIL) 25 MG tablet Take 1 tablet by mouth daily. 05/22/20 05/22/21 Yes [provider]  ipratropium (ATROVENT) 0.06 % nasal spray Place 2 sprays into both nostrils 4 (four) times daily. 05/14/21  Yes Margarette Canada, NP  olmesartan (BENICAR) 40 MG tablet Take by mouth. 03/20/20  Yes [provider]  promethazine-dextromethorphan (PROMETHAZINE-DM) 6.25-15 MG/5ML syrup Take 5 mLs by mouth 4 (four) times daily  as needed. 05/14/21  Yes Margarette Canada, NP  Prenatal Vit-Fe Fumarate-FA (PNV PRENATAL PLUS MULTIVITAMIN) 27-1 MG TABS Take by mouth.    [provider]  fluticasone (FLONASE) 50 MCG/ACT nasal spray Place 2 sprays into both nostrils daily. 10/08/16 07/15/19  Menshew, Dannielle Karvonen, PA-C  losartan (COZAAR) 25 MG tablet Take 25 mg by mouth daily.  07/15/19  [provider]    Family History Family History  Adopted: Yes  Problem Relation Age of Onset   Healthy Mother     Social History Social History   Tobacco Use   Smoking status: Never   Smokeless tobacco: Never  Vaping Use   Vaping Use: Never used  Substance Use Topics   Alcohol use: Yes   Drug use: No     Allergies   Tramadol   Review of Systems Review of Systems  Constitutional:  Negative for activity change, appetite change and fever.  HENT:  Positive for congestion, ear pain, postnasal drip, rhinorrhea and sore throat.   Respiratory:  Positive for cough. Negative for shortness of breath and wheezing.   Gastrointestinal:  Negative for diarrhea, nausea and vomiting.  Musculoskeletal:  Negative  for arthralgias and myalgias.  Skin:  Negative for rash.  Hematological: Negative.   Psychiatric/Behavioral: Negative.      Physical Exam Triage Vital Signs ED Triage Vitals  Enc Vitals Group     BP 05/14/21 1130 (!) 169/118     Pulse Rate 05/14/21 1130 88     Resp 05/14/21 1130 18     Temp 05/14/21 1130 99.2 F (37.3 C)     Temp Source 05/14/21 1130 Oral     SpO2 05/14/21 1130 97 %     Weight 05/14/21 1128 186 lb 15.2 oz (84.8 kg)     Height 05/14/21 1128 '5\' 2"'$  (1.575 m)     Head Circumference --      Peak Flow --      Pain Score 05/14/21 1127 6     Pain Loc --      Pain Edu? --      Excl. in [place]? --    No data found.  Updated Vital Signs BP (!) 169/118 (BP Location: Left Arm)   Pulse 88   Temp 99.2 F (37.3 C) (Oral)   Resp 18   Ht '5\' 2"'$  (1.575 m)   Wt 186 lb 15.2 oz (84.8 kg)   LMP  05/14/2021 (Approximate)   SpO2 97%   Breastfeeding No   BMI 34.19 kg/m   Visual Acuity Right Eye Distance:   Left Eye Distance:   Bilateral Distance:    Right Eye Near:   Left Eye Near:    Bilateral Near:     Physical Exam Vitals and nursing note reviewed.  Constitutional:      General: She is not in acute distress.    Appearance: Normal appearance. She is not ill-appearing.  HENT:     Head: Normocephalic and atraumatic.     Right Ear: Tympanic membrane, ear canal and external ear normal. There is no impacted cerumen.     Left Ear: Tympanic membrane, ear canal and external ear normal. There is no impacted cerumen.     Nose: Congestion and rhinorrhea present.     Mouth/Throat:     Mouth: Mucous membranes are moist.     Pharynx: Oropharynx is clear. Posterior oropharyngeal erythema present.  Cardiovascular:     Rate and Rhythm: Normal rate and regular rhythm.     Pulses: Normal pulses.     Heart sounds: Normal heart sounds. No murmur heard.   No gallop.  Pulmonary:     Effort: Pulmonary effort is normal.     Breath sounds: Normal breath sounds. No wheezing, rhonchi or rales.  Musculoskeletal:     Cervical back: Normal range of motion and neck supple.  Lymphadenopathy:     Cervical: No cervical adenopathy.  Skin:    General: Skin is warm and dry.     Capillary Refill: Capillary refill takes less than 2 seconds.     Findings: No erythema or rash.  Neurological:     General: No focal deficit present.     Mental Status: She is alert and oriented to person, place, and time.  Psychiatric:        Mood and Affect: Mood normal.        Behavior: Behavior normal.        Thought Content: Thought content normal.        Judgment: Judgment normal.     UC Treatments / Results  Labs (all labs ordered are listed, but only abnormal results are displayed) Labs Reviewed  SARS CORONAVIRUS 2 (  TAT 6-24 HRS)    EKG   Radiology No results found.  Procedures Procedures  (including critical care time)  Medications Ordered in UC Medications - No data to display  Initial Impression / Assessment and Plan / UC Course  I have reviewed the triage vital signs and the nursing notes.  Pertinent labs & imaging results that were available during my care of the patient were reviewed by me and considered in my medical decision making (see chart for details).  Patient is a very pleasant, nontoxic-appearing 40 year old female here for evaluation of respiratory complaints as outlined HPI above.  Triage note indicates that the patient is producing bloody sputum with a cough but unclear the case with the patient she states that she is having intermittently productive cough for yellow sputum and she occasionally coughs so hard that she has some streaks of blood in it but is not having bloody sputum.  Physical exam reveals pearly gray tympanic membranes bilaterally with normal light reflex and clear external auditory canals.  Nasal mucosa is erythematous and edematous with clear nasal discharge.  Patient has no tenderness to percussion of maxillary or frontal sinuses.  Oropharyngeal exam reveals posterior oropharyngeal erythema with clear postnasal drip.  No cervical lymphadenopathy appreciated exam.  Cardiopulmonary exam reveals clear lung sounds in all fields.  Patient had taken a COVID test the day after her symptoms started which was negative.  Will repeat COVID test here as patient may have tested too early.  Her exam is consistent with a upper respiratory infection, most likely viral.  We will treat patient with Atrovent nasal spray to help with her nasal congestion and postnasal drip, Tessalon Perles and Promethazine DM cough syrup to help with cough and congestion.  Patient advised to isolate pending the results of the COVID test and she was informed that she will need to quarantine for period of 5 days from the onset of symptoms if she test positive.  ER and return precautions  reviewed with patient.   Final Clinical Impressions(s) / UC Diagnoses   Final diagnoses:  Viral URI with cough     Discharge Instructions      Isolate at home pending the results of your COVID test.  If you test positive then you will have to quarantine for 5 days from the start of your symptoms.  After 5 days you can break quarantine if your symptoms have improved and you have not had a fever for 24 hours without taking Tylenol or ibuprofen.  Use over-the-counter Tylenol and ibuprofen as needed for body aches and fever.  Use the Tessalon Perles during the day as needed for cough and the Promethazine DM cough syrup at nighttime as will make you drowsy.  Use the Atrovent nasal spray, 2 squirts in each nostril every 6 hours, as needed for nasal congestion and postnasal drip.   If you develop any increased shortness of breath-especially at rest, you are unable to speak in full sentences, or is a late sign your lips are turning blue you need to go the ER for evaluation.      ED Prescriptions     Medication Sig Dispense Auth. Provider   benzonatate (TESSALON) 100 MG capsule Take 2 capsules (200 mg total) by mouth every 8 (eight) hours. 21 capsule Margarette Canada, NP   ipratropium (ATROVENT) 0.06 % nasal spray Place 2 sprays into both nostrils 4 (four) times daily. 15 mL Margarette Canada, NP   promethazine-dextromethorphan (PROMETHAZINE-DM) 6.25-15 MG/5ML syrup Take 5 mLs  by mouth 4 (four) times daily as needed. 118 mL Margarette Canada, NP      PDMP not reviewed this encounter.   Margarette Canada, NP 05/14/21 [phone]

## 2022-01-09 ENCOUNTER — Ambulatory Visit
Admission: EM | Admit: 2022-01-09 | Discharge: 2022-01-09 | Disposition: A | Discharge status: Home / Self Care | Payer: Federal, State, Local not specified - PPO | Attending: Emergency Medicine | Admitting: Emergency Medicine

## 2022-01-09 ENCOUNTER — Other Ambulatory Visit: Payer: Self-pay

## 2022-01-09 ENCOUNTER — Encounter: Payer: Self-pay | Admitting: Emergency Medicine

## 2022-01-09 DIAGNOSIS — J069 Acute upper respiratory infection, unspecified: Secondary | ICD-10-CM

## 2022-01-09 LAB — GROUP A STREP BY PCR: Group A Strep by PCR: NOT DETECTED

## 2022-01-09 MED ORDER — PROMETHAZINE-DM 6.25-15 MG/5ML PO SYRP: 5.0000 mL | ORAL_SOLUTION | Freq: Four times a day (QID) | ORAL | 0 refills | Status: DC | PRN

## 2022-01-09 MED ORDER — BENZONATATE 100 MG PO CAPS: 200.0000 mg | ORAL_CAPSULE | Freq: Three times a day (TID) | ORAL | 0 refills | Status: DC

## 2022-01-09 MED ORDER — FLUCONAZOLE 150 MG PO TABS: 150.0000 mg | ORAL_TABLET | Freq: Every day | ORAL | 0 refills | Status: AC

## 2022-01-09 MED ORDER — AMOXICILLIN-POT CLAVULANATE 875-125 MG PO TABS: 1.0000 | ORAL_TABLET | Freq: Two times a day (BID) | ORAL | 0 refills | Status: DC

## 2022-01-09 NOTE — Discharge Instructions (Signed)
Your strep test was negative ? ?I believe your symptoms today began as a virus however since your illness has been present for 10 days with no signs of improvement we may begin bacterial coverage for the upper airways ? ?Take Augmentin twice daily for 7 days, Diflucan will be at the pharmacy for you if you begin to have a yeast symptoms, you should begin to see improvement in about 48 hours of medication use and steady progression from ? ?Begin the following below in addition ? ?Take Flonase every morning, this medication is a steroid nasal spray which helps to loosen secretions out of the sinus passageway as well as to reduce the amount of secretions present ? ?Take an antihistamine such as Claritin or Zyrtec, this medication reduces the amount of secretions that the body will produce ? ?May also attempt saline irrigation ? ?Increase your fluid intake through use of water to help thin your secretions ? ?You may use salt water gargles, throat lozenges, warm liquids, teaspoons of honey for additional support to your throat ? ? ?You may follow-up with urgent care as needed for persisting symptoms ? ?

## 2022-01-09 NOTE — ED Triage Notes (Signed)
Patient c/o sore throat, cough, nasal congestion, and post nasal drip that started a week ago.  Patient states that she still has her sore throat.  Patient denies fevers.  ?

## 2022-01-09 NOTE — ED Provider Notes (Signed)
?[place] ? ? ? ?CSN: [id number] ?Arrival date & time: 01/09/22  9702 ? ? ?  ? ?History   ?Chief Complaint ?Chief Complaint  ?Patient presents with  ? Cough  ? Nasal Congestion  ? ? ?HPI ?Whitney Carey is a 41 y.o. female.  ? ?Patient presents with nasal congestion, rhinorrhea, postnasal drip, sore throat and a nonproductive cough for 10 days.  Painful to swallow, throat feels like there are razors present.  Coughing and sore throat are worsened at nighttime.  Ear pain is bilateral with associated popping sounds.  Decreased appetite but tolerating soft foods and liquids.  No known sick contacts.  History of seasonal allergies.  Has attempted use of Coricidin, Mucinex, honey and throat lozenges with minimal relief.   ? ?Past Medical History:  ?Diagnosis Date  ? Genital herpes simplex   ? Hypertension   ? Migraine   ? ? ?Patient Active Problem List  ? Diagnosis Date Noted  ? Seroma complicating a procedure 05/01/2013  ? Lipoma of axilla 04/17/2013  ? ? ?History reviewed. No pertinent surgical history. ? ?OB History   ? ? Gravida  ?4  ? Para  ?3  ? Term  ?   ? Preterm  ?   ? AB  ?   ? Living  ?3  ?  ? ? SAB  ?   ? IAB  ?   ? Ectopic  ?   ? Multiple  ?   ? Live Births  ?   ?   ?  ? Obstetric Comments  ?1st Menstrual Cycle:  12  ?1st Pregnancy:  14  ?  ? ?  ? ? ? ?Home Medications   ? ?Prior to Admission medications   ?Medication Sig Start Date End Date Taking? Authorizing Provider  ?hydrochlorothiazide (HYDRODIURIL) 25 MG tablet Take 1 tablet by mouth daily. 05/22/20 01/09/22 Yes [provider]  ?olmesartan (BENICAR) 40 MG tablet Take by mouth. 03/20/20  Yes [provider]  ?benzonatate (TESSALON) 100 MG capsule Take 2 capsules (200 mg total) by mouth every 8 (eight) hours. 05/14/21   Margarette Canada, NP  ?ipratropium (ATROVENT) 0.06 % nasal spray Place 2 sprays into both nostrils 4 (four) times daily. 05/14/21   Margarette Canada, NP  ?Prenatal Vit-Fe Fumarate-FA (PNV PRENATAL PLUS MULTIVITAMIN) 27-1 MG  TABS Take by mouth.    [provider]  ?promethazine-dextromethorphan (PROMETHAZINE-DM) 6.25-15 MG/5ML syrup Take 5 mLs by mouth 4 (four) times daily as needed. 05/14/21   Margarette Canada, NP  ?fluticasone (FLONASE) 50 MCG/ACT nasal spray Place 2 sprays into both nostrils daily. 10/08/16 07/15/19  Menshew, Dannielle Karvonen, PA-C  ?losartan (COZAAR) 25 MG tablet Take 25 mg by mouth daily.  07/15/19  [provider]  ? ? ?Family History ?Family History  ?Adopted: Yes  ?Problem Relation Age of Onset  ? Healthy Mother   ? ? ?Social History ?Social History  ? ?Tobacco Use  ? Smoking status: Never  ? Smokeless tobacco: Never  ?Vaping Use  ? Vaping Use: Never used  ?Substance Use Topics  ? Alcohol use: Yes  ? Drug use: No  ? ? ? ?Allergies   ?Tramadol ? ? ?Review of Systems ?Review of Systems  ?Constitutional: Negative.   ?HENT:  Positive for congestion, postnasal drip, rhinorrhea and sore throat. Negative for dental problem, drooling, ear discharge, ear pain, facial swelling, hearing loss, mouth sores, nosebleeds, sinus pressure, sinus pain, sneezing, tinnitus, trouble swallowing and voice change.   ?Respiratory:  Positive for cough. Negative for apnea, choking, chest tightness, shortness of breath, wheezing and stridor.   ?Cardiovascular: Negative.   ?Gastrointestinal: Negative.   ?Skin: Negative.   ?Neurological: Negative.   ? ? ?Physical Exam ?Triage Vital Signs ?ED Triage Vitals  ?Enc Vitals Group  ?   BP 01/09/22 0824 (!) 164/107  ?   Pulse Rate 01/09/22 0824 79  ?   Resp 01/09/22 0824 14  ?   Temp 01/09/22 0824 99.1 ?F (37.3 ?C)  ?   Temp Source 01/09/22 0824 Oral  ?   SpO2 01/09/22 0824 97 %  ?   Weight 01/09/22 0821 170 lb (77.1 kg)  ?   Height 01/09/22 0821 '5\' 2"'$  (1.575 m)  ?   Head Circumference --   ?   Peak Flow --   ?   Pain Score 01/09/22 0820 7  ?   Pain Loc --   ?   Pain Edu? --   ?   Excl. in [place]? --   ? ?No data found. ? ?Updated Vital Signs ?BP (!) 164/107 (BP Location: Left Arm) Comment:  Patient has not taken her BP medicine today  Pulse 79   Temp 99.1 ?F (37.3 ?C) (Oral)   Resp 14   Ht '5\' 2"'$  (1.575 m)   Wt 170 lb (77.1 kg)   LMP 12/19/2021 (Approximate)   SpO2 97%   BMI 31.09 kg/m?  ? ?Visual Acuity ?Right Eye Distance:   ?Left Eye Distance:   ?Bilateral Distance:   ? ?Right Eye Near:   ?Left Eye Near:    ?Bilateral Near:    ? ?Physical Exam ?Constitutional:   ?   Appearance: Normal appearance.  ?HENT:  ?   Head: Normocephalic.  ?   Right Ear: Tympanic membrane, ear canal and external ear normal.  ?   Left Ear: Tympanic membrane, ear canal and external ear normal.  ?   Nose: Congestion and rhinorrhea present.  ?   Mouth/Throat:  ?   Mouth: Mucous membranes are moist.  ?   Pharynx: Oropharynx is clear.  ?   Tonsils: No tonsillar exudate. 1+ on the right. 1+ on the left.  ?Eyes:  ?   Extraocular Movements: Extraocular movements intact.  ?Cardiovascular:  ?   Rate and Rhythm: Normal rate and regular rhythm.  ?   Pulses: Normal pulses.  ?   Heart sounds: Normal heart sounds.  ?Pulmonary:  ?   Effort: Pulmonary effort is normal.  ?   Breath sounds: Normal breath sounds.  ?Skin: ?   General: Skin is warm and dry.  ?Neurological:  ?   Mental Status: She is alert and oriented to person, place, and time. Mental status is at baseline.  ?Psychiatric:     ?   Mood and Affect: Mood normal.     ?   Behavior: Behavior normal.  ? ? ? ?UC Treatments / Results  ?Labs ?(all labs ordered are listed, but only abnormal results are displayed) ?Labs Reviewed  ?GROUP A STREP BY PCR  ? ? ?EKG ? ? ?Radiology ?No results found. ? ?Procedures ?Procedures (including critical care time) ? ?Medications Ordered in UC ?Medications - No data to display ? ?Initial Impression / Assessment and Plan / UC Course  ?I have reviewed the triage vital signs and the nursing notes. ? ?Pertinent labs & imaging results that were available during my care of the patient were reviewed by me and considered in my medical decision making (see  chart  for details). ? ?Acute upper respiratory infection ? ?Vital signs are stable, well ill-appearing patient is in no signs of distress, strep PCR negative, discussed findings with patient, as illness has been present for 10 days with no signs of resolution, will move forward with bacterial coverage, Augmentin 7-day course prescribed as well as Tessalon and promethazine DM for management of coughing, recommended Flonase and an antihistamine for additional support as always I believe her congestion is exacerbating her sore throat, may attempt use of salt water gargles, throat lozenges, warm liquids, honey for additional supportive care, may follow-up with urgent care as needed ?Final Clinical Impressions(s) / UC Diagnoses  ? ?Final diagnoses:  ?None  ? ?Discharge Instructions   ?None ?  ? ?ED Prescriptions   ?None ?  ? ?PDMP not reviewed this encounter. ?  ?Hans Eden, NP ?01/09/22 0919 ? ?

## 2022-03-25 ENCOUNTER — Encounter: Payer: Self-pay | Admitting: Emergency Medicine

## 2022-03-25 ENCOUNTER — Ambulatory Visit
Admission: EM | Admit: 2022-03-25 | Discharge: 2022-03-25 | Disposition: A | Discharge status: Home / Self Care | Payer: Federal, State, Local not specified - PPO | Attending: Family Medicine | Admitting: Family Medicine

## 2022-03-25 DIAGNOSIS — N760 Acute vaginitis: Secondary | ICD-10-CM

## 2022-03-25 LAB — POCT URINALYSIS DIP (MANUAL ENTRY)
Bilirubin, UA: NEGATIVE
Blood, UA: NEGATIVE
Glucose, UA: NEGATIVE mg/dL
Ketones, POC UA: NEGATIVE mg/dL
Nitrite, UA: NEGATIVE
Protein Ur, POC: NEGATIVE mg/dL
Spec Grav, UA: 1.025 (ref 1.010–1.025)
Urobilinogen, UA: 0.2 E.U./dL
pH, UA: 6 (ref 5.0–8.0)

## 2022-03-25 MED ORDER — METRONIDAZOLE 500 MG PO TABS: 500.0000 mg | ORAL_TABLET | Freq: Two times a day (BID) | ORAL | 0 refills | Status: DC

## 2022-03-25 MED ORDER — FLUCONAZOLE 150 MG PO TABS: 150.0000 mg | ORAL_TABLET | ORAL | 0 refills | Status: DC | PRN

## 2022-03-25 NOTE — Discharge Instructions (Addendum)
Your lab work will be available within 2-3 business days.  In the meantime I have prescribed you both Diflucan for treatment of yeast and metronidazole for treatment of BV.  Once your labs results if you are positive for BV start the metronidazole.  However given the description of your symptoms suspect more likely that it is yeast so start the Diflucan today and you may take every 3 days as needed.

## 2022-03-25 NOTE — ED Provider Notes (Signed)
[facility]    CSN: [id number] Arrival date & time: 03/25/22  1225      History   Chief Complaint Chief Complaint  Patient presents with   Vaginal Itching    3 day vaginal irritation and thick white discharge. No odor. Have not tried anything OTC. - Entered by patient   Vaginal Discharge    HPI Whitney Carey is a 41 y.o. female.   HPI Patient presents for evaluation of vaginitis symptoms x 3 days. She reports recently engaging in swimming and use of a new body wash. She subsequently developed symptoms of vaginal itching and thick vaginal discharge.  She will be traveling out of the country and would also like her urine checked to ensure that UTIs are not the source of her symptoms.  Denies,  dysuria, hematuria or flank pain. Past Medical History:  Diagnosis Date   Genital herpes simplex    Hypertension    Migraine     Patient Active Problem List   Diagnosis Date Noted   Seroma complicating a procedure 05/01/2013   Lipoma of axilla 04/17/2013    History reviewed. No pertinent surgical history.  OB History     Gravida  4   Para  3   Term      Preterm      AB      Living  3      SAB      IAB      Ectopic      Multiple      Live Births           Obstetric Comments  1st Menstrual Cycle:  12  1st Pregnancy:  14          Home Medications    Prior to Admission medications   Medication Sig Start Date End Date Taking? Authorizing Provider  fluconazole (DIFLUCAN) 150 MG tablet Take 1 tablet (150 mg total) by mouth every three (3) days as needed. Repeat if needed 03/25/22  Yes Scot Jun, FNP  metroNIDAZOLE (FLAGYL) 500 MG tablet Take 1 tablet (500 mg total) by mouth 2 (two) times daily. 03/25/22  Yes Scot Jun, FNP  NIFEdipine (PROCARDIA XL/NIFEDICAL-XL) 90 MG 24 hr tablet Take by mouth. 08/24/21 08/24/22 Yes [provider]  olmesartan (BENICAR) 40 MG tablet Take 1 tablet by mouth daily. 08/24/21 08/24/22 Yes  [provider]  valACYclovir (VALTREX) 500 MG tablet Take by mouth. 02/26/22  Yes [provider]  benzonatate (TESSALON) 100 MG capsule Take 2 capsules (200 mg total) by mouth every 8 (eight) hours. 01/09/22   White, Leitha Schuller, NP  hydrochlorothiazide (HYDRODIURIL) 25 MG tablet Take 1 tablet by mouth daily. 05/22/20 01/09/22  [provider]  Prenatal Vit-Fe Fumarate-FA (PNV PRENATAL PLUS MULTIVITAMIN) 27-1 MG TABS Take by mouth.    [provider]  promethazine-dextromethorphan (PROMETHAZINE-DM) 6.25-15 MG/5ML syrup Take 5 mLs by mouth 4 (four) times daily as needed. 01/09/22   White, Leitha Schuller, NP  fluticasone (FLONASE) 50 MCG/ACT nasal spray Place 2 sprays into both nostrils daily. 10/08/16 07/15/19  Menshew, Dannielle Karvonen, PA-C  losartan (COZAAR) 25 MG tablet Take 25 mg by mouth daily.  07/15/19  [provider]    Family History Family History  Adopted: Yes  Problem Relation Age of Onset   Healthy Mother     Social History Social History   Tobacco Use   Smoking status: Never   Smokeless tobacco: Never  Vaping Use  Vaping Use: Never used  Substance Use Topics   Alcohol use: Yes   Drug use: No     Allergies   Tramadol   Review of Systems Review of Systems Pertinent negatives listed in HPI   Physical Exam Triage Vital Signs ED Triage Vitals  Enc Vitals Group     BP      Pulse      Resp      Temp      Temp src      SpO2      Weight      Height      Head Circumference      Peak Flow      Pain Score      Pain Loc      Pain Edu?      Excl. in [place]?    No data found.  Updated Vital Signs BP (!) 148/82 (BP Location: Left Arm)   Pulse 70   Temp 98.9 F (37.2 C) (Oral)   Resp 16   LMP 03/02/2022   SpO2 97%   Visual Acuity Right Eye Distance:   Left Eye Distance:   Bilateral Distance:    Right Eye Near:   Left Eye Near:    Bilateral Near:     Physical Exam Constitutional:      Appearance: Normal  appearance.  HENT:     Head: Normocephalic.  Cardiovascular:     Rate and Rhythm: Normal rate and regular rhythm.  Pulmonary:     Effort: Pulmonary effort is normal.     Breath sounds: Normal breath sounds.  Skin:    General: Skin is warm and dry.     Capillary Refill: Capillary refill takes less than 2 seconds.  Neurological:     General: No focal deficit present.     Mental Status: She is alert.    Vaginal Cytology pending UC Treatments / Results  Labs (all labs ordered are listed, but only abnormal results are displayed) Labs Reviewed  POCT URINALYSIS DIP (MANUAL ENTRY) - Abnormal; Notable for the following components:      Result Value   Leukocytes, UA Small (1+) (*)    All other components within normal limits  CERVICOVAGINAL ANCILLARY ONLY    EKG   Radiology No results found.  Procedures Procedures (including critical care time)  Medications Ordered in UC Medications - No data to display  Initial Impression / Assessment and Plan / UC Course  I have reviewed the triage vital signs and the nursing notes.  Pertinent labs & imaging results that were available during my care of the patient were reviewed by me and considered in my medical decision making (see chart for details).    Vaginitis  Vaginal Cytology pending.  UA unremarkable no urine culture pending. Diflucan prescribed. Metronidazole prescribed as patient does have a history of BV advised not to start medication until she receives her results.  Patient will be traveling out of the country prior to receipt of her test results therefore we discussed options and agreed to go ahead and prescribe the metronidazole she will take only if needed. Final Clinical Impressions(s) / UC Diagnoses   Final diagnoses:  Vaginitis and vulvovaginitis     Discharge Instructions      Your lab work will be available within 2-3 business days.  In the meantime I have prescribed you both Diflucan for treatment of yeast  and metronidazole for treatment of BV.  Once your labs results if you are  positive for BV start the metronidazole.  However given the description of your symptoms suspect more likely that it is yeast so start the Diflucan today and you may take every 3 days as needed.     ED Prescriptions     Medication Sig Dispense Auth. Provider   fluconazole (DIFLUCAN) 150 MG tablet Take 1 tablet (150 mg total) by mouth every three (3) days as needed. Repeat if needed 4 tablet Scot Jun, FNP   metroNIDAZOLE (FLAGYL) 500 MG tablet Take 1 tablet (500 mg total) by mouth 2 (two) times daily. 14 tablet Scot Jun, FNP      PDMP not reviewed this encounter.   Scot Jun, FNP 03/25/22 1708

## 2022-03-25 NOTE — ED Triage Notes (Signed)
Pt presents with vaginal itching and discharge x 3 days.

## 2022-03-29 LAB — CERVICOVAGINAL ANCILLARY ONLY
Bacterial Vaginitis (gardnerella): NEGATIVE
Candida Glabrata: NEGATIVE
Candida Vaginitis: POSITIVE — AB
Comment: NEGATIVE
Comment: NEGATIVE
Comment: NEGATIVE

## 2022-06-05 ENCOUNTER — Ambulatory Visit
Admission: EM | Admit: 2022-06-05 | Discharge: 2022-06-05 | Disposition: A | Discharge status: Home / Self Care | Payer: Federal, State, Local not specified - PPO | Attending: Emergency Medicine | Admitting: Emergency Medicine

## 2022-06-05 ENCOUNTER — Encounter: Payer: Self-pay | Admitting: Emergency Medicine

## 2022-06-05 DIAGNOSIS — B9689 Other specified bacterial agents as the cause of diseases classified elsewhere: Secondary | ICD-10-CM | POA: Insufficient documentation

## 2022-06-05 DIAGNOSIS — B3731 Acute candidiasis of vulva and vagina: Secondary | ICD-10-CM | POA: Diagnosis not present

## 2022-06-05 DIAGNOSIS — N760 Acute vaginitis: Secondary | ICD-10-CM

## 2022-06-05 LAB — URINALYSIS, MICROSCOPIC (REFLEX)

## 2022-06-05 LAB — URINALYSIS, ROUTINE W REFLEX MICROSCOPIC
Bilirubin Urine: NEGATIVE
Glucose, UA: NEGATIVE mg/dL
Hgb urine dipstick: NEGATIVE
Ketones, ur: NEGATIVE mg/dL
Nitrite: NEGATIVE
Protein, ur: 100 mg/dL — AB
Specific Gravity, Urine: 1.025 (ref 1.005–1.030)
pH: 6 (ref 5.0–8.0)

## 2022-06-05 LAB — WET PREP, GENITAL
Sperm: NONE SEEN
Trich, Wet Prep: NONE SEEN
WBC, Wet Prep HPF POC: <10 (ref <10)

## 2022-06-05 MED ORDER — FLUCONAZOLE 150 MG PO TABS: 150.0000 mg | ORAL_TABLET | ORAL | 0 refills | Status: DC | PRN

## 2022-06-05 MED ORDER — METRONIDAZOLE 500 MG PO TABS: 500.0000 mg | ORAL_TABLET | Freq: Two times a day (BID) | ORAL | 0 refills | Status: DC

## 2022-06-05 NOTE — ED Triage Notes (Signed)
Patient c/o vaginal discharge, lower back pain and vaginal itching, and lower abdominal pain for a week.

## 2022-06-05 NOTE — ED Provider Notes (Signed)
[facility]    CSN: [id number] Arrival date & time: 06/05/22  1216      History   Chief Complaint Chief Complaint  Patient presents with   Vaginal Discharge   Back Pain   Abdominal Pain    HPI Whitney Carey is a 41 y.o. female.   HPI  59 old female here for evaluation of GYN complaints.  Patient reports that for last week she has been experiencing vaginal itching with these thick vaginal discharge that is associated with low back pain and suprapubic pain.  This is not associate with any fever, pain with urination or urinary urgency or frequency.  She denies any blood in her urine, nausea or vomiting, or concerns for STIs.  No recent antibiotic therapy.  She reports that her symptoms are the exact same as when she was evaluated in July.  At that time she had vaginal yeast infection and BV.  Past Medical History:  Diagnosis Date   Genital herpes simplex    Hypertension    Migraine     Patient Active Problem List   Diagnosis Date Noted   Seroma complicating a procedure 05/01/2013   Lipoma of axilla 04/17/2013    History reviewed. No pertinent surgical history.  OB History     Gravida  4   Para  3   Term      Preterm      AB      Living  3      SAB      IAB      Ectopic      Multiple      Live Births           Obstetric Comments  1st Menstrual Cycle:  12  1st Pregnancy:  14          Home Medications    Prior to Admission medications   Medication Sig Start Date End Date Taking? Authorizing Provider  hydrochlorothiazide (HYDRODIURIL) 25 MG tablet Take 1 tablet by mouth daily. 05/22/20 06/05/22 Yes [provider]  NIFEdipine (PROCARDIA XL/NIFEDICAL-XL) 90 MG 24 hr tablet Take by mouth. 08/24/21 08/24/22 Yes [provider]  olmesartan (BENICAR) 40 MG tablet Take 1 tablet by mouth daily. 08/24/21 08/24/22 Yes [provider]  Prenatal Vit-Fe Fumarate-FA (PNV PRENATAL PLUS MULTIVITAMIN) 27-1 MG TABS Take  by mouth.   Yes [provider]  fluconazole (DIFLUCAN) 150 MG tablet Take 1 tablet (150 mg total) by mouth every three (3) days as needed. Repeat if needed 06/05/22   Margarette Canada, NP  metroNIDAZOLE (FLAGYL) 500 MG tablet Take 1 tablet (500 mg total) by mouth 2 (two) times daily. 06/05/22   Margarette Canada, NP  valACYclovir (VALTREX) 500 MG tablet Take by mouth. 02/26/22   [provider]  fluticasone (FLONASE) 50 MCG/ACT nasal spray Place 2 sprays into both nostrils daily. 10/08/16 07/15/19  Menshew, Dannielle Karvonen, PA-C  losartan (COZAAR) 25 MG tablet Take 25 mg by mouth daily.  07/15/19  [provider]    Family History Family History  Adopted: Yes  Problem Relation Age of Onset   Healthy Mother     Social History Social History   Tobacco Use   Smoking status: Never   Smokeless tobacco: Never  Vaping Use   Vaping Use: Never used  Substance Use Topics   Alcohol use: Yes   Drug use: No     Allergies   Tramadol   Review of Systems Review of Systems  Constitutional:  Negative for fever.  Gastrointestinal:  Positive for abdominal pain. Negative for nausea and vomiting.  Genitourinary:  Positive for vaginal discharge and vaginal pain. Negative for dysuria, frequency, hematuria and urgency.  Musculoskeletal:  Positive for back pain.  Skin:  Negative for rash.  Hematological: Negative.   Psychiatric/Behavioral: Negative.       Physical Exam Triage Vital Signs ED Triage Vitals  Enc Vitals Group     BP 06/05/22 1227 (!) 159/104     Pulse Rate 06/05/22 1227 73     Resp 06/05/22 1227 14     Temp 06/05/22 1227 98.5 F (36.9 C)     Temp Source 06/05/22 1227 Oral     SpO2 06/05/22 1227 96 %     Weight 06/05/22 1224 170 lb (77.1 kg)     Height 06/05/22 1224 '5\' 2"'$  (1.575 m)     Head Circumference --      Peak Flow --      Pain Score 06/05/22 1224 4     Pain Loc --      Pain Edu? --      Excl. in [place]? --    No data found.  Updated Vital Signs BP  (!) 159/104 (BP Location: Right Arm)   Pulse 73   Temp 98.5 F (36.9 C) (Oral)   Resp 14   Ht '5\' 2"'$  (1.575 m)   Wt 170 lb (77.1 kg)   LMP 05/28/2022 (Approximate)   SpO2 96%   BMI 31.09 kg/m   Visual Acuity Right Eye Distance:   Left Eye Distance:   Bilateral Distance:    Right Eye Near:   Left Eye Near:    Bilateral Near:     Physical Exam Vitals and nursing note reviewed.  Constitutional:      Appearance: Normal appearance. She is not ill-appearing.  HENT:     Head: Normocephalic and atraumatic.  Cardiovascular:     Rate and Rhythm: Normal rate and regular rhythm.     Pulses: Normal pulses.     Heart sounds: Normal heart sounds. No murmur heard.    No friction rub. No gallop.  Pulmonary:     Effort: Pulmonary effort is normal.     Breath sounds: Normal breath sounds. No wheezing, rhonchi or rales.  Abdominal:     General: Abdomen is flat.     Palpations: Abdomen is soft.     Tenderness: There is no abdominal tenderness. There is no right CVA tenderness, left CVA tenderness, guarding or rebound.  Skin:    General: Skin is warm and dry.     Capillary Refill: Capillary refill takes less than 2 seconds.     Findings: No erythema or rash.  Neurological:     General: No focal deficit present.     Mental Status: She is alert and oriented to person, place, and time.  Psychiatric:        Mood and Affect: Mood normal.        Behavior: Behavior normal.        Thought Content: Thought content normal.        Judgment: Judgment normal.      UC Treatments / Results  Labs (all labs ordered are listed, but only abnormal results are displayed) Labs Reviewed  WET PREP, GENITAL - Abnormal; Notable for the following components:      Result Value   Yeast Wet Prep HPF POC PRESENT (*)    Clue Cells Wet Prep HPF POC PRESENT (*)  All other components within normal limits  URINALYSIS, ROUTINE W REFLEX MICROSCOPIC - Abnormal; Notable for the following components:   APPearance  HAZY (*)    Protein, ur 100 (*)    Leukocytes,Ua TRACE (*)    All other components within normal limits  URINALYSIS, MICROSCOPIC (REFLEX) - Abnormal; Notable for the following components:   Bacteria, UA FEW (*)    All other components within normal limits    EKG   Radiology No results found.  Procedures Procedures (including critical care time)  Medications Ordered in UC Medications - No data to display  Initial Impression / Assessment and Plan / UC Course  I have reviewed the triage vital signs and the nursing notes.  Pertinent labs & imaging results that were available during my care of the patient were reviewed by me and considered in my medical decision making (see chart for details).   Patient is a very pleasant, nontoxic-appearing 41 year old female here for evaluation of vaginal itching with vaginal discharge that is associated with low back pain and suprapubic pain that has been going on for the past week.  She states her symptoms are exactly the same as they were in July when she was evaluated here.  At that time she was diagnosed with BV and yeast.  She denies any recent antibiotics.  No nausea or vomiting and no fever.  No urinary symptoms.  Her physical exam reveals a benign cardiopulmonary exam with S1-S2 heart sounds revealing regular rate and rhythm and lung sounds are auscultation all fields.  No CVA tenderness on exam.  Abdomen soft and nontender.  I will order urinalysis and vaginal wet prep.  Urinalysis shows a hazy appearance with 100 protein and trace leukocyte esterase.  No nitrites, ketones, or hemoglobin.  Reflex micro shows few bacteria with mucus and budding yeast present.  6-10 WBCs.  Vaginal wet prep is positive for both yeast and clue cells.  I will discharge patient home with diagnosis of yeast vaginitis and also bacterial vaginosis.  I will repeat her treatment with metronidazole and Diflucan as was done in July.  Return and ER precautions  reviewed.   Final Clinical Impressions(s) / UC Diagnoses   Final diagnoses:  BV (bacterial vaginosis)  Vaginal yeast infection     Discharge Instructions      Take the Flagyl (metronidazole) 500 mg twice daily for treatment of your bacterial vaginosis.  Avoid alcohol while on the metronidazole as taken together will cause of vomiting.  Bacterial vaginosis is often caused by a imbalance of bacteria in your vaginal vault.  This is sometimes a result of using tampons or hormonal fluctuations during her menstrual cycle.  You if your symptoms are recurrent you can try using a boric acid suppository twice weekly to help maintain the acid-base balance in your vagina vault which could prevent further infection.  You can also try vaginal probiotics to help return normal bacterial balance.   Take one Diflucan tablet now and repeat in 1 week after you complete the course of flagyl.     ED Prescriptions     Medication Sig Dispense Auth. Provider   metroNIDAZOLE (FLAGYL) 500 MG tablet Take 1 tablet (500 mg total) by mouth 2 (two) times daily. 14 tablet Margarette Canada, NP   fluconazole (DIFLUCAN) 150 MG tablet Take 1 tablet (150 mg total) by mouth every three (3) days as needed. Repeat if needed 4 tablet Margarette Canada, NP      PDMP not reviewed this encounter.  Margarette Canada, NP 06/05/22 1255

## 2022-06-05 NOTE — Discharge Instructions (Signed)
Take the Flagyl (metronidazole) 500 mg twice daily for treatment of your bacterial vaginosis.  Avoid alcohol while on the metronidazole as taken together will cause of vomiting.  Bacterial vaginosis is often caused by a imbalance of bacteria in your vaginal vault.  This is sometimes a result of using tampons or hormonal fluctuations during her menstrual cycle.  You if your symptoms are recurrent you can try using a boric acid suppository twice weekly to help maintain the acid-base balance in your vagina vault which could prevent further infection.  You can also try vaginal probiotics to help return normal bacterial balance.   Take one Diflucan tablet now and repeat in 1 week after you complete the course of flagyl.

## 2022-06-06 ENCOUNTER — Ambulatory Visit: Payer: Self-pay

## 2022-07-22 ENCOUNTER — Ambulatory Visit
Admission: EM | Admit: 2022-07-22 | Discharge: 2022-07-22 | Disposition: A | Discharge status: Home / Self Care | Payer: Federal, State, Local not specified - PPO | Attending: Family Medicine | Admitting: Family Medicine

## 2022-07-22 ENCOUNTER — Ambulatory Visit: Admit: 2022-07-22 | Payer: Federal, State, Local not specified - PPO

## 2022-07-22 DIAGNOSIS — N760 Acute vaginitis: Secondary | ICD-10-CM | POA: Insufficient documentation

## 2022-07-22 DIAGNOSIS — R030 Elevated blood-pressure reading, without diagnosis of hypertension: Secondary | ICD-10-CM | POA: Diagnosis present

## 2022-07-22 DIAGNOSIS — B3731 Acute candidiasis of vulva and vagina: Secondary | ICD-10-CM | POA: Insufficient documentation

## 2022-07-22 LAB — URINALYSIS, ROUTINE W REFLEX MICROSCOPIC
Bilirubin Urine: NEGATIVE
Glucose, UA: NEGATIVE mg/dL
Hgb urine dipstick: NEGATIVE
Ketones, ur: NEGATIVE mg/dL
Leukocytes,Ua: NEGATIVE
Nitrite: NEGATIVE
Protein, ur: NEGATIVE mg/dL
Specific Gravity, Urine: 1.025 (ref 1.005–1.030)
pH: 5.5 (ref 5.0–8.0)

## 2022-07-22 LAB — WET PREP, GENITAL
Clue Cells Wet Prep HPF POC: NONE SEEN
Sperm: NONE SEEN
Trich, Wet Prep: NONE SEEN
WBC, Wet Prep HPF POC: <10 — AB (ref <10)

## 2022-07-22 MED ORDER — FLUCONAZOLE 150 MG PO TABS: 150.0000 mg | ORAL_TABLET | ORAL | 0 refills | Status: DC | PRN

## 2022-07-22 NOTE — ED Triage Notes (Signed)
Patient reports that she has vaginal itching and thick white discharge -- started about 3 days ago.

## 2022-07-22 NOTE — ED Provider Notes (Signed)
[facility]    CSN: [id number] Arrival date & time: 07/22/22  1833      History   Chief Complaint Chief Complaint  Patient presents with   Vaginal Itching    APPOINTMENT     HPI Whitney Carey is a 41 y.o. female.   Patient presents today with a 3-day history of vaginal irritation.  She reports this began as a slight discomfort but has developed into soreness and pruritus.  She reports associated thick white discharge.  She denies any history of diabetes and does not take any SGLT2 inhibitors.  She reports recurrent symptoms and was last seen 06/05/2022 at which point she tested positive for yeast and bacterial vaginosis.  Reports symptoms did resolve with medication.  Denies any recent antibiotics.  She denies any significant urinary symptoms.  Does report some discomfort with urination but attributes this more to urine touching her skin.  Denies any abdominal pain, pelvic pain, fever, nausea, vomiting.  She has no concern for pregnancy.  She does report that she recently changed the type of fabric softener that she uses wonders if this could have triggered symptoms.  Blood pressure is elevated today.  She has a history of hypertension and is working with her PCP and pharmacist to get this under control.  She denies any current symptoms including chest pain, shortness of breath, headache, vision change, dizziness.  She has been taking medication as prescribed without missing doses.  Denies any increased sodium consumption, caffeine use, NSAIDs, decongestants.  She has a follow-up appointment 07/26/2022.    Past Medical History:  Diagnosis Date   Genital herpes simplex    Hypertension    Migraine     Patient Active Problem List   Diagnosis Date Noted   Seroma complicating a procedure 05/01/2013   Lipoma of axilla 04/17/2013    History reviewed. No pertinent surgical history.  OB History     Gravida  4   Para  3   Term      Preterm      AB      Living  3       SAB      IAB      Ectopic      Multiple      Live Births           Obstetric Comments  1st Menstrual Cycle:  12  1st Pregnancy:  14          Home Medications    Prior to Admission medications   Medication Sig Start Date End Date Taking? Authorizing Provider  fluconazole (DIFLUCAN) 150 MG tablet Take 1 tablet (150 mg total) by mouth every three (3) days as needed. Repeat if needed 07/22/22   Jayvin Hurrell K, PA-C  hydrochlorothiazide (HYDRODIURIL) 25 MG tablet Take 1 tablet by mouth daily. 05/22/20 06/05/22  [provider]  metroNIDAZOLE (FLAGYL) 500 MG tablet Take 1 tablet (500 mg total) by mouth 2 (two) times daily. 06/05/22   Margarette Canada, NP  NIFEdipine (PROCARDIA XL/NIFEDICAL-XL) 90 MG 24 hr tablet Take by mouth. 08/24/21 08/24/22  [provider]  olmesartan (BENICAR) 40 MG tablet Take 1 tablet by mouth daily. 08/24/21 08/24/22  [provider]  Prenatal Vit-Fe Fumarate-FA (PNV PRENATAL PLUS MULTIVITAMIN) 27-1 MG TABS Take by mouth.    [provider]  valACYclovir (VALTREX) 500 MG tablet Take by mouth. 02/26/22   [provider]  fluticasone (FLONASE) 50 MCG/ACT nasal spray Place 2 sprays into both  nostrils daily. 10/08/16 07/15/19  Menshew, Dannielle Karvonen, PA-C  losartan (COZAAR) 25 MG tablet Take 25 mg by mouth daily.  07/15/19  [provider]    Family History Family History  Adopted: Yes  Problem Relation Age of Onset   Healthy Mother     Social History Social History   Tobacco Use   Smoking status: Never   Smokeless tobacco: Never  Vaping Use   Vaping Use: Never used  Substance Use Topics   Alcohol use: Yes   Drug use: No     Allergies   Tramadol   Review of Systems Review of Systems  Constitutional:  Positive for activity change. Negative for appetite change, fatigue and fever.  Eyes:  Negative for visual disturbance.  Respiratory:  Negative for cough and shortness of breath.    Cardiovascular:  Negative for chest pain.  Gastrointestinal:  Negative for abdominal pain, diarrhea, nausea and vomiting.  Genitourinary:  Positive for dysuria and vaginal discharge. Negative for frequency, pelvic pain, urgency, vaginal bleeding and vaginal pain.  Neurological:  Negative for dizziness, light-headedness and headaches.     Physical Exam Triage Vital Signs ED Triage Vitals  Enc Vitals Group     BP 07/22/22 1845 (!) 180/115     Pulse Rate 07/22/22 1845 80     Resp --      Temp 07/22/22 1845 98.5 F (36.9 C)     Temp Source 07/22/22 1845 Oral     SpO2 07/22/22 1845 95 %     Weight 07/22/22 1843 170 lb (77.1 kg)     Height 07/22/22 1843 '5\' 2"'$  (1.575 m)     Head Circumference --      Peak Flow --      Pain Score 07/22/22 1843 2     Pain Loc --      Pain Edu? --      Excl. in [place]? --    No data found.  Updated Vital Signs BP (!) 178/108 (BP Location: Left Arm)   Pulse 80   Temp 98.5 F (36.9 C) (Oral)   Ht '5\' 2"'$  (1.575 m)   Wt 170 lb (77.1 kg)   LMP 07/07/2022 (Approximate)   SpO2 95%   BMI 31.09 kg/m   Visual Acuity Right Eye Distance:   Left Eye Distance:   Bilateral Distance:    Right Eye Near:   Left Eye Near:    Bilateral Near:     Physical Exam Vitals reviewed.  Constitutional:      General: She is awake. She is not in acute distress.    Appearance: Normal appearance. She is well-developed. She is not ill-appearing.     Comments: Very pleasant female appears stated age in no acute distress sitting comfortably in exam room  HENT:     Head: Normocephalic and atraumatic.  Cardiovascular:     Rate and Rhythm: Normal rate and regular rhythm.     Heart sounds: Normal heart sounds, S1 normal and S2 normal. No murmur heard. Pulmonary:     Effort: Pulmonary effort is normal.     Breath sounds: Normal breath sounds. No wheezing, rhonchi or rales.     Comments: Clear to auscultation bilaterally Abdominal:     General: Bowel sounds are normal.      Palpations: Abdomen is soft.     Tenderness: There is no abdominal tenderness. There is no right CVA tenderness, left CVA tenderness, guarding or rebound.     Comments: Benign abdominal exam  Psychiatric:        Behavior: Behavior is cooperative.      UC Treatments / Results  Labs (all labs ordered are listed, but only abnormal results are displayed) Labs Reviewed  WET PREP, GENITAL - Abnormal; Notable for the following components:      Result Value   Yeast Wet Prep HPF POC PRESENT (*)    WBC, Wet Prep HPF POC <10 (*)    All other components within normal limits  URINALYSIS, ROUTINE W REFLEX MICROSCOPIC    EKG   Radiology No results found.  Procedures Procedures (including critical care time)  Medications Ordered in UC Medications - No data to display  Initial Impression / Assessment and Plan / UC Course  I have reviewed the triage vital signs and the nursing notes.  Pertinent labs & imaging results that were available during my care of the patient were reviewed by me and considered in my medical decision making (see chart for details).     Patient is well-appearing, afebrile, nontoxic, nontachycardic.  Wet prep was obtained that showed yeast.  Patient was treated with Diflucan with instruction take 1 tablet today and second tablet in 3 days.  An additional set of doses was sent to the pharmacy given recurrent symptoms.  Recommended echogenic soaps and detergents and she should wear loosefitting cotton underwear.  Urine was obtained and was normal.  Discussed that if she has any worsening symptoms or if anything changes she needs to be seen immediately.  Strict return precautions given.  Blood pressure is very elevated.  She denies any signs/symptoms of endorgan damage.  She is encouraged to continue her medication as prescribed and follow-up closely with her primary care as scheduled.  She is to avoid decongestants, caffeine, sodium.  Discussed that if she develops any  chest pain, shortness of breath, headache, vision change, dizziness in the setting of high blood pressure she needs to go to the emergency room.  Final Clinical Impressions(s) / UC Diagnoses   Final diagnoses:  Acute vaginitis  Yeast vaginitis  Elevated blood pressure reading     Discharge Instructions      It appears you have a yeast infection.  Please take Diflucan as prescribed.  Wear loosefitting cotton underwear and use several genic soaps and detergents.  If you have any worsening or changing symptoms please return for reevaluation.  Your blood pressure is very elevated.  Please follow-up with your primary care as scheduled next week.  If you develop any chest pain, shortness of breath, headache, vision change, dizziness in the setting of high blood pressure you need to go to the emergency room immediately.     ED Prescriptions     Medication Sig Dispense Auth. Provider   fluconazole (DIFLUCAN) 150 MG tablet Take 1 tablet (150 mg total) by mouth every three (3) days as needed. Repeat if needed 4 tablet Sanders Manninen K, PA-C      PDMP not reviewed this encounter.   Terrilee Croak, PA-C 07/22/22 1926

## 2022-07-22 NOTE — Discharge Instructions (Signed)
It appears you have a yeast infection.  Please take Diflucan as prescribed.  Wear loosefitting cotton underwear and use several genic soaps and detergents.  If you have any worsening or changing symptoms please return for reevaluation.  Your blood pressure is very elevated.  Please follow-up with your primary care as scheduled next week.  If you develop any chest pain, shortness of breath, headache, vision change, dizziness in the setting of high blood pressure you need to go to the emergency room immediately.

## 2022-07-23 ENCOUNTER — Ambulatory Visit: Payer: Federal, State, Local not specified - PPO

## 2022-11-16 ENCOUNTER — Ambulatory Visit
Admission: EM | Admit: 2022-11-16 | Discharge: 2022-11-16 | Disposition: A | Discharge status: Home / Self Care | Payer: Federal, State, Local not specified - PPO | Attending: Physician Assistant | Admitting: Physician Assistant

## 2022-11-16 ENCOUNTER — Other Ambulatory Visit: Payer: Self-pay

## 2022-11-16 DIAGNOSIS — R350 Frequency of micturition: Secondary | ICD-10-CM

## 2022-11-16 DIAGNOSIS — B3731 Acute candidiasis of vulva and vagina: Secondary | ICD-10-CM | POA: Diagnosis not present

## 2022-11-16 DIAGNOSIS — N760 Acute vaginitis: Secondary | ICD-10-CM | POA: Diagnosis present

## 2022-11-16 DIAGNOSIS — I10 Essential (primary) hypertension: Secondary | ICD-10-CM

## 2022-11-16 DIAGNOSIS — B9689 Other specified bacterial agents as the cause of diseases classified elsewhere: Secondary | ICD-10-CM | POA: Diagnosis present

## 2022-11-16 LAB — URINALYSIS, W/ REFLEX TO CULTURE (INFECTION SUSPECTED)
Bilirubin Urine: NEGATIVE
Glucose, UA: NEGATIVE mg/dL
Hgb urine dipstick: NEGATIVE
Ketones, ur: NEGATIVE mg/dL
Leukocytes,Ua: NEGATIVE
Nitrite: NEGATIVE
Protein, ur: 100 mg/dL — AB
Specific Gravity, Urine: 1.025 (ref 1.005–1.030)
pH: 6.5 (ref 5.0–8.0)

## 2022-11-16 LAB — WET PREP, GENITAL
Sperm: NONE SEEN
Trich, Wet Prep: NONE SEEN
WBC, Wet Prep HPF POC: <10 — AB (ref <10)

## 2022-11-16 LAB — GLUCOSE, CAPILLARY: Glucose-Capillary: 100 mg/dL — ABNORMAL HIGH (ref 70–99)

## 2022-11-16 MED ORDER — TERCONAZOLE 0.4 % VA CREA: 1.0000 | TOPICAL_CREAM | Freq: Every day | VAGINAL | 0 refills | Status: DC

## 2022-11-16 MED ORDER — FLUCONAZOLE 150 MG PO TABS: 150.0000 mg | ORAL_TABLET | ORAL | 0 refills | Status: DC | PRN

## 2022-11-16 MED ORDER — METRONIDAZOLE 500 MG PO TABS: 500.0000 mg | ORAL_TABLET | Freq: Two times a day (BID) | ORAL | 0 refills | Status: DC

## 2022-11-16 NOTE — ED Triage Notes (Signed)
Pt states vaginal discharge and itching and clear vaginal discharge. Believes her kids' soap threw off her pH

## 2022-11-16 NOTE — Discharge Instructions (Addendum)
You have yeast and bacterial vaginosis.  Take fluconazole 1 dose today and a second dose in 3 days.  Use topical terconazole to help with other symptoms.  Begin metronidazole to cover for bacterial vaginosis.  Do not drink any alcohol while on this medication for 3 days after completing course.  Use hypoallergenic soaps and detergents.  Wear loosefitting cotton underwear.  If you develop any additional symptoms you need to be seen immediately.  We will contact you if any of your other lab work is abnormal and we need to change your treatment plan.  Your blood pressure is elevated.  Please continue her blood pressure medication as previously prescribed.  Follow-up with your primary care for reevaluation.  If you develop any chest pain, shortness of breath, headache, vision change, dizziness in the setting of high blood pressure you need to be seen immediately.  Avoid NSAIDs (aspirin, ibuprofen/Advil, naproxen/Aleve), decongestants, caffeine, sodium.

## 2022-11-16 NOTE — ED Provider Notes (Signed)
[facility]    CSN: [id number] Arrival date & time: 11/16/22  1822      History   Chief Complaint Chief Complaint  Patient presents with   Vaginal Itching   Vaginal Discharge    HPI Whitney Carey is a 42 y.o. female.   Patient presents today with a 2 to 3-day history of vaginal irritation.  Reports that she has developed thick white discharge as well as associated pruritus.  She has a history of recurrent yeast infections with similar presentation.  She has not tried any over-the-counter medication for symptom management.  She denies history of diabetes or immunosuppression but does report some urinary frequency as well as polyuria/polydipsia.  She denies any recent antibiotics.  She does report changing her soaps and wonders if this could have triggered symptoms.  She is no specific concern for STI as she is sexually active with the same partner.  Blood pressure is very elevated.  She is monitoring this with her primary care and on several antihypertensives.  She reports compliance with this medication.  They are working to get this under control.  She denies any current headache, dizziness, chest pain, shortness of breath, visual disturbance.  Denies any recent NSAID use, coughing, sodium, decongestants.    Past Medical History:  Diagnosis Date   Genital herpes simplex    Hypertension    Migraine     Patient Active Problem List   Diagnosis Date Noted   Seroma complicating a procedure 05/01/2013   Lipoma of axilla 04/17/2013    History reviewed. No pertinent surgical history.  OB History     Gravida  4   Para  3   Term      Preterm      AB      Living  3      SAB      IAB      Ectopic      Multiple      Live Births           Obstetric Comments  1st Menstrual Cycle:  12  1st Pregnancy:  14          Home Medications    Prior to Admission medications   Medication Sig Start Date End Date Taking? Authorizing Provider  ferrous  sulfate 325 (65 FE) MG tablet Take by mouth. 02/26/22 06/21/23 Yes [provider]  hydrochlorothiazide (HYDRODIURIL) 25 MG tablet Take 1 tablet by mouth daily. 04/14/22 04/14/23 Yes [provider]  NIFEdipine (PROCARDIA XL/NIFEDICAL-XL) 90 MG 24 hr tablet Take by mouth. 04/14/22 04/14/23 Yes [provider]  terconazole (TERAZOL 7) 0.4 % vaginal cream Place 1 applicator vaginally at bedtime. 11/16/22  Yes Adryel Wortmann K, PA-C  fluconazole (DIFLUCAN) 150 MG tablet Take 1 tablet (150 mg total) by mouth every three (3) days as needed. Repeat if needed 11/16/22   Jahni Paul K, PA-C  metroNIDAZOLE (FLAGYL) 500 MG tablet Take 1 tablet (500 mg total) by mouth 2 (two) times daily. 11/16/22   Cj Edgell, Derry Skill, PA-C  Multiple Vitamin (MULTI-VITAMIN) tablet Take 1 tablet by mouth daily.    [provider]  olmesartan (BENICAR) 40 MG tablet Take 1 tablet by mouth daily. 08/24/21 08/24/22  [provider]  valACYclovir (VALTREX) 500 MG tablet Take by mouth. 02/26/22   [provider]  fluticasone (FLONASE) 50 MCG/ACT nasal spray Place 2 sprays into both nostrils daily. 10/08/16 07/15/19  Menshew, Dannielle Karvonen, PA-C  losartan (COZAAR) 25 MG  tablet Take 25 mg by mouth daily.  07/15/19  [provider]    Family History Family History  Adopted: Yes  Problem Relation Age of Onset   Healthy Mother     Social History Social History   Tobacco Use   Smoking status: Never   Smokeless tobacco: Never  Vaping Use   Vaping Use: Never used  Substance Use Topics   Alcohol use: Yes    Comment: wine occasionally   Drug use: No     Allergies   Tramadol   Review of Systems Review of Systems  Constitutional:  Positive for activity change. Negative for appetite change, fatigue and fever.  Gastrointestinal:  Negative for abdominal pain, diarrhea, nausea and vomiting.  Endocrine: Positive for polyuria. Negative for polydipsia and polyphagia.  Genitourinary:   Positive for frequency and vaginal discharge. Negative for dysuria, urgency, vaginal bleeding and vaginal pain.     Physical Exam Triage Vital Signs ED Triage Vitals  Enc Vitals Group     BP 11/16/22 1844 (!) 170/100     Pulse Rate 11/16/22 1844 77     Resp 11/16/22 1844 18     Temp 11/16/22 1844 98.6 F (37 C)     Temp Source 11/16/22 1844 Oral     SpO2 11/16/22 1844 96 %     Weight 11/16/22 1837 165 lb (74.8 kg)     Height 11/16/22 1837 '5\' 2"'$  (1.575 m)     Head Circumference --      Peak Flow --      Pain Score 11/16/22 1837 0     Pain Loc --      Pain Edu? --      Excl. in [place]? --    No data found.  Updated Vital Signs BP (!) 170/100 (BP Location: Right Arm)   Pulse 77   Temp 98.6 F (37 C) (Oral)   Resp 18   Ht '5\' 2"'$  (1.575 m)   Wt 165 lb (74.8 kg)   LMP 11/08/2022   SpO2 96%   BMI 30.18 kg/m   Visual Acuity Right Eye Distance:   Left Eye Distance:   Bilateral Distance:    Right Eye Near:   Left Eye Near:    Bilateral Near:     Physical Exam Vitals reviewed.  Constitutional:      General: She is awake. She is not in acute distress.    Appearance: Normal appearance. She is well-developed. She is not ill-appearing.     Comments: Very pleasant female appears stated age in no acute distress sitting comfortably in exam room  HENT:     Head: Normocephalic and atraumatic.  Cardiovascular:     Rate and Rhythm: Normal rate and regular rhythm.     Heart sounds: Normal heart sounds, S1 normal and S2 normal. No murmur heard. Pulmonary:     Effort: Pulmonary effort is normal.     Breath sounds: Normal breath sounds. No wheezing, rhonchi or rales.     Comments: Clear to auscultation bilaterally Abdominal:     General: Bowel sounds are normal.     Palpations: Abdomen is soft.     Tenderness: There is no abdominal tenderness. There is no right CVA tenderness, left CVA tenderness, guarding or rebound.  Psychiatric:        Behavior: Behavior is cooperative.       UC Treatments / Results  Labs (all labs ordered are listed, but only abnormal results are displayed) Labs Reviewed  WET  PREP, GENITAL - Abnormal; Notable for the following components:      Result Value   Yeast Wet Prep HPF POC PRESENT (*)    Clue Cells Wet Prep HPF POC PRESENT (*)    WBC, Wet Prep HPF POC <10 (*)    All other components within normal limits  GLUCOSE, CAPILLARY - Abnormal; Notable for the following components:   Glucose-Capillary 100 (*)    All other components within normal limits  URINALYSIS, W/ REFLEX TO CULTURE (INFECTION SUSPECTED) - Abnormal; Notable for the following components:   APPearance HAZY (*)    Protein, ur 100 (*)    Bacteria, UA FEW (*)    All other components within normal limits  CBG MONITORING, ED  CERVICOVAGINAL ANCILLARY ONLY    EKG   Radiology No results found.  Procedures Procedures (including critical care time)  Medications Ordered in UC Medications - No data to display  Initial Impression / Assessment and Plan / UC Course  I have reviewed the triage vital signs and the nursing notes.  Pertinent labs & imaging results that were available during my care of the patient were reviewed by me and considered in my medical decision making (see chart for details).     Patient is well-appearing, afebrile, nontoxic, nontachycardic.  Wet prep showed yeast and clue cells.  Initially we ordered a STI swab, however, after conversation patient had no concern for STI and so this was canceled.  Unfortunately, we were unable to cancel in system but the specimen was disposed of and not sent to the lab.  Patient did report some signs/symptoms of diabetes but random glucose was appropriate at 100.  UA did not show any glucosuria or evidence of acute infection..  Will treat with Diflucan 1 dose every 72 hours for total of 2 doses.  Will also use topical terconazole for additional symptom relief.  She was also started on metronidazole to help with  bacterial vaginosis identified on wet prep with instruction not to drink any alcohol with this medication due to risk of GI bleeding.  Recommend that she is hypoallergenic soaps and detergents and wear loosefitting cotton underwear.  She is to follow-up with her primary care for further evaluation and management.  Discussed that if she has any worsening or changing symptoms she needs to be seen immediately.  Blood pressure is elevated today.  Patient denies any signs/symptoms of endorgan damage.  She is working with her primary care closely to adjust her medications and recommended close follow-up with them; she has an appointment next week.  She is to avoid NSAIDs, decongestants, caffeine, sodium.  Discussed that if she develops any chest pain, shortness of breath, headache, vision change, dizziness in setting of high blood pressure she needs to be seen immediately.  Strict return precautions given.  Final Clinical Impressions(s) / UC Diagnoses   Final diagnoses:  Vaginal yeast infection  BV (bacterial vaginosis)  Acute vaginitis  Urinary frequency  Elevated blood pressure reading in office with diagnosis of hypertension     Discharge Instructions      You have yeast and bacterial vaginosis.  Take fluconazole 1 dose today and a second dose in 3 days.  Use topical terconazole to help with other symptoms.  Begin metronidazole to cover for bacterial vaginosis.  Do not drink any alcohol while on this medication for 3 days after completing course.  Use hypoallergenic soaps and detergents.  Wear loosefitting cotton underwear.  If you develop any additional symptoms you need  to be seen immediately.  We will contact you if any of your other lab work is abnormal and we need to change your treatment plan.  Your blood pressure is elevated.  Please continue her blood pressure medication as previously prescribed.  Follow-up with your primary care for reevaluation.  If you develop any chest pain, shortness of  breath, headache, vision change, dizziness in the setting of high blood pressure you need to be seen immediately.  Avoid NSAIDs (aspirin, ibuprofen/Advil, naproxen/Aleve), decongestants, caffeine, sodium.     ED Prescriptions     Medication Sig Dispense Auth. Provider   fluconazole (DIFLUCAN) 150 MG tablet Take 1 tablet (150 mg total) by mouth every three (3) days as needed. Repeat if needed 2 tablet Ernestyne Caldwell K, PA-C   terconazole (TERAZOL 7) 0.4 % vaginal cream Place 1 applicator vaginally at bedtime. 45 g Kareema Keitt K, PA-C   metroNIDAZOLE (FLAGYL) 500 MG tablet Take 1 tablet (500 mg total) by mouth 2 (two) times daily. 14 tablet Dontray Haberland, Derry Skill, PA-C      PDMP not reviewed this encounter.   Terrilee Croak, PA-C 11/16/22 1958

## 2023-06-23 ENCOUNTER — Ambulatory Visit
Admission: EM | Admit: 2023-06-23 | Discharge: 2023-06-23 | Disposition: A | Discharge status: Home / Self Care | Payer: Federal, State, Local not specified - PPO | Attending: Family Medicine | Admitting: Family Medicine

## 2023-06-23 DIAGNOSIS — J069 Acute upper respiratory infection, unspecified: Secondary | ICD-10-CM | POA: Diagnosis not present

## 2023-06-23 DIAGNOSIS — U071 COVID-19: Secondary | ICD-10-CM | POA: Insufficient documentation

## 2023-06-23 LAB — RESP PANEL BY RT-PCR (RSV, FLU A&B, COVID)  RVPGX2
Influenza A by PCR: NEGATIVE
Influenza B by PCR: NEGATIVE
Resp Syncytial Virus by PCR: NEGATIVE
SARS Coronavirus 2 by RT PCR: POSITIVE — AB

## 2023-06-23 MED ORDER — HYDROCOD POLI-CHLORPHE POLI ER 10-8 MG/5ML PO SUER: 5.0000 mL | Freq: Two times a day (BID) | ORAL | 0 refills | Status: DC | PRN

## 2023-06-23 MED ORDER — BENZONATATE 100 MG PO CAPS
100.0000 mg | ORAL_CAPSULE | Freq: Three times a day (TID) | ORAL | 0 refills | Status: DC | PRN
Start: 2023-06-23 — End: 2023-07-26

## 2023-06-23 NOTE — ED Triage Notes (Signed)
Cough x 3 days.  Headache started last night. Believes it was from a medication she took for the hough that interacted with her BP meds.

## 2023-06-23 NOTE — Discharge Instructions (Signed)
We will contact you if your COVID, influenza or RSV test is positive.  Please quarantine while you wait for the results.  If your test is negative you may resume normal activities.  If your test is positive, quarantine until you are without a fever for at least 24 hours without fever-lowering (Tylenol/Motrin) medications.    Stop by the pharmacy to pick up your cough medications.   You can take Tylenol and/or Ibuprofen as needed for fever reduction and pain relief.    For cough: honey 1/2 to 1 teaspoon (you can dilute the honey in water or another fluid).  You can also use guaifenesin and dextromethorphan for cough. You can use a humidifier for chest congestion and cough.  If you don't have a humidifier, you can sit in the bathroom with the hot shower running.       It is important to stay hydrated: drink plenty of fluids (water, gatorade/powerade/pedialyte, juices, or teas) to keep your throat moisturized and help further relieve irritation/discomfort.    Return or go to the Emergency Department if symptoms worsen or do not improve in the next few days

## 2023-06-23 NOTE — ED Provider Notes (Signed)
[facility]    CSN: [id number] Arrival date & time: 06/23/23  1235      History   Chief Complaint Chief Complaint  Patient presents with   Cough    HPI Whitney Carey is a 42 y.o. female.   HPI  History obtained from the patient. Whitney Carey presents for deep cough for the past 3 days. Took some hot herbal tea, honey, dayquil, coricidin. Took some Advil. She was out at homecoming this past weekend. No fever, vomiting, diarrhea, belly pain or rhinorrhea, Endorses sneezing and itching in her ear.  An upcoming hysterectomy wants to be sure that she does not have anything communicable.     Past Medical History:  Diagnosis Date   Genital herpes simplex    Hypertension    Migraine     Patient Active Problem List   Diagnosis Date Noted   Seroma complicating a procedure 05/01/2013   Lipoma of axilla 04/17/2013    No past surgical history on file.  OB History     Gravida  4   Para  3   Term      Preterm      AB      Living  3      SAB      IAB      Ectopic      Multiple      Live Births           Obstetric Comments  1st Menstrual Cycle:  12  1st Pregnancy:  14          Home Medications    Prior to Admission medications   Medication Sig Start Date End Date Taking? Authorizing Provider  benzonatate (TESSALON) 100 MG capsule Take 1-2 capsules (100-200 mg total) by mouth 3 (three) times daily as needed for cough. 06/23/23  Yes Dornell Grasmick, DO  chlorpheniramine-HYDROcodone (TUSSIONEX) 10-8 MG/5ML Take 5 mLs by mouth every 12 (twelve) hours as needed for cough. 06/23/23  Yes Iokepa Geffre, DO  ferrous sulfate 325 (65 FE) MG tablet Take by mouth. 02/26/22 06/21/23  [provider]  fluconazole (DIFLUCAN) 150 MG tablet Take 1 tablet (150 mg total) by mouth every three (3) days as needed. Repeat if needed 11/16/22   Raspet, Erin K, PA-C  hydrochlorothiazide (HYDRODIURIL) 25 MG tablet Take 1 tablet by mouth daily. 04/14/22 04/14/23   [provider]  metroNIDAZOLE (FLAGYL) 500 MG tablet Take 1 tablet (500 mg total) by mouth 2 (two) times daily. 11/16/22   Raspet, Noberto Retort, PA-C  Multiple Vitamin (MULTI-VITAMIN) tablet Take 1 tablet by mouth daily.    [provider]  NIFEdipine (PROCARDIA XL/NIFEDICAL-XL) 90 MG 24 hr tablet Take by mouth. 04/14/22 04/14/23  [provider]  olmesartan (BENICAR) 40 MG tablet Take 1 tablet by mouth daily. 08/24/21 08/24/22  [provider]  terconazole (TERAZOL 7) 0.4 % vaginal cream Place 1 applicator vaginally at bedtime. 11/16/22   Raspet, Noberto Retort, PA-C  valACYclovir (VALTREX) 500 MG tablet Take by mouth. 02/26/22   [provider]  fluticasone (FLONASE) 50 MCG/ACT nasal spray Place 2 sprays into both nostrils daily. 10/08/16 07/15/19  Menshew, Charlesetta Ivory, PA-C  losartan (COZAAR) 25 MG tablet Take 25 mg by mouth daily.  07/15/19  [provider]    Family History Family History  Adopted: Yes  Problem Relation Age of Onset   Healthy Mother     Social History Social History   Tobacco Use   Smoking status:  Never   Smokeless tobacco: Never  Vaping Use   Vaping status: Never Used  Substance Use Topics   Alcohol use: Yes    Comment: wine occasionally   Drug use: No     Allergies   Tramadol   Review of Systems Review of Systems: negative unless otherwise stated in HPI.      Physical Exam Triage Vital Signs ED Triage Vitals  Encounter Vitals Group     BP 06/23/23 1328 (!) 131/91     Systolic BP Percentile --      Diastolic BP Percentile --      Pulse Rate 06/23/23 1328 85     Resp 06/23/23 1328 18     Temp 06/23/23 1328 98.5 F (36.9 C)     Temp Source 06/23/23 1328 Oral     SpO2 06/23/23 1328 99 %     Weight --      Height --      Head Circumference --      Peak Flow --      Pain Score 06/23/23 1327 0     Pain Loc --      Pain Education --      Exclude from Growth Chart --    No data found.  Updated Vital  Signs BP (!) 131/91 (BP Location: Left Arm)   Pulse 85   Temp 98.5 F (36.9 C) (Oral)   Resp 18   LMP 06/18/2023 (Exact Date)   SpO2 99%   Visual Acuity Right Eye Distance:   Left Eye Distance:   Bilateral Distance:    Right Eye Near:   Left Eye Near:    Bilateral Near:     Physical Exam GEN:     alert, non-toxic appearing female in no distress    HENT:  mucus membranes moist, oropharyngeal without lesions or erythema, no tonsillar hypertrophy or exudates,  no  nasal discharge EYES:   pupils equal and reactive, no scleral injection or discharge NECK:  normal ROM, no meningismus   RESP:  no increased work of breathing, clear to auscultation bilaterally CVS:   regular rate and rhythm Skin:   warm and dry    UC Treatments / Results  Labs (all labs ordered are listed, but only abnormal results are displayed) Labs Reviewed  RESP PANEL BY RT-PCR (RSV, FLU A&B, COVID)  RVPGX2 - Abnormal; Notable for the following components:      Result Value   SARS Coronavirus 2 by RT PCR POSITIVE (*)    All other components within normal limits    EKG   Radiology No results found.  Procedures Procedures (including critical care time)  Medications Ordered in UC Medications - No data to display  Initial Impression / Assessment and Plan / UC Course  I have reviewed the triage vital signs and the nursing notes.  Pertinent labs & imaging results that were available during my care of the patient were reviewed by me and considered in my medical decision making (see chart for details).       Pt is a 42 y.o. female who presents for 3 days of respiratory symptoms. Whitney Carey is afebrile here without recent antipyretics. Satting well on room air. Overall pt is non-toxic appearing, well hydrated, without respiratory distress. Pulmonary exam is unremarkable.  She was recently around large crowds of people.  COVID, RSV and influenza testing obtained. Pt to quarantine until COVID test results or  longer if positive.  I will call patient with test results,  if positive. History consistent with viral respiratory illness. Discussed symptomatic treatment.  Explained lack of efficacy of antibiotics in viral disease.  Typical duration of symptoms discussed.  Treat cough with Tessalon Perles and Tussionex.   Return and ED precautions given and voiced understanding. Discussed MDM, treatment plan and plan for follow-up with patient who agrees with plan.   COVID is positive however influenza and RSV were negative.  Patient called and updated with results.  All questions asked were answered.  We will defer antivirals at this time.    Final Clinical Impressions(s) / UC Diagnoses   Final diagnoses:  COVID-19  Viral URI with cough     Discharge Instructions      We will contact you if your COVID, influenza or RSV test is positive.  Please quarantine while you wait for the results.  If your test is negative you may resume normal activities.  If your test is positive, quarantine until you are without a fever for at least 24 hours without fever-lowering (Tylenol/Motrin) medications.    Stop by the pharmacy to pick up your cough medications.   You can take Tylenol and/or Ibuprofen as needed for fever reduction and pain relief.    For cough: honey 1/2 to 1 teaspoon (you can dilute the honey in water or another fluid).  You can also use guaifenesin and dextromethorphan for cough. You can use a humidifier for chest congestion and cough.  If you don't have a humidifier, you can sit in the bathroom with the hot shower running.       It is important to stay hydrated: drink plenty of fluids (water, gatorade/powerade/pedialyte, juices, or teas) to keep your throat moisturized and help further relieve irritation/discomfort.    Return or go to the Emergency Department if symptoms worsen or do not improve in the next few days      ED Prescriptions     Medication Sig Dispense Auth. Provider    benzonatate (TESSALON) 100 MG capsule Take 1-2 capsules (100-200 mg total) by mouth 3 (three) times daily as needed for cough. 30 capsule Chey Cho, DO   chlorpheniramine-HYDROcodone (TUSSIONEX) 10-8 MG/5ML Take 5 mLs by mouth every 12 (twelve) hours as needed for cough. 115 mL Lakya Schrupp, Seward Meth, DO      I have reviewed the PDMP during this encounter.   Katha Cabal, DO 06/23/23 1702

## 2023-07-26 ENCOUNTER — Ambulatory Visit
Admission: EM | Admit: 2023-07-26 | Discharge: 2023-07-26 | Disposition: A | Discharge status: Home / Self Care | Payer: Federal, State, Local not specified - PPO | Attending: Emergency Medicine | Admitting: Emergency Medicine

## 2023-07-26 ENCOUNTER — Encounter: Payer: Self-pay | Admitting: Emergency Medicine

## 2023-07-26 DIAGNOSIS — S0096XA Insect bite (nonvenomous) of unspecified part of head, initial encounter: Secondary | ICD-10-CM | POA: Diagnosis not present

## 2023-07-26 DIAGNOSIS — I10 Essential (primary) hypertension: Secondary | ICD-10-CM | POA: Diagnosis not present

## 2023-07-26 DIAGNOSIS — K529 Noninfective gastroenteritis and colitis, unspecified: Secondary | ICD-10-CM

## 2023-07-26 DIAGNOSIS — M26609 Unspecified temporomandibular joint disorder, unspecified side: Secondary | ICD-10-CM

## 2023-07-26 DIAGNOSIS — W57XXXA Bitten or stung by nonvenomous insect and other nonvenomous arthropods, initial encounter: Secondary | ICD-10-CM

## 2023-07-26 MED ORDER — METHYLPREDNISOLONE 4 MG PO TBPK: ORAL_TABLET | ORAL | 0 refills | Status: AC

## 2023-07-26 MED ORDER — ONDANSETRON 8 MG PO TBDP: 8.0000 mg | ORAL_TABLET | Freq: Three times a day (TID) | ORAL | 0 refills | Status: AC | PRN

## 2023-07-26 MED ORDER — DICYCLOMINE HCL 20 MG PO TABS: 20.0000 mg | ORAL_TABLET | Freq: Two times a day (BID) | ORAL | 0 refills | Status: AC

## 2023-07-26 NOTE — Discharge Instructions (Addendum)
Take the Zofran every 8 hours as needed for nausea and vomiting.  They are an oral disintegrating tablet and you can place them on her under your tongue and then will be absorbed.  Use the Bentyl (dicyclomine) every 6 hours as needed for abdominal cramping.  Follow a clear liquid diet for the next 6 to 12 hours.  Clear liquids consist of broth, ginger ale, water, Pedialyte, and Jell-O.  After 6 to 12 hours, if you are tolerating clear liquids, you can advance to bland foods such as bananas, rice, applesauce, and toast.  If you tolerate bland foods you can continue to advance your diet as you see fit.  If you develop a fever over 100.5, increased abdominal pain, bloody vomit, or bloody stool return for reevaluation or go to the ER.   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  This is inflammation of your temporomandibular joints.  This can cause pain when you chew and may even include pain to your ear.  Take the Medrol Dosepak according to the package instructions.  You will start this tomorrow morning at breakfast and you will take it over period of 6 days decreasing your dose each day.  I recommend that you purchase a nightguard and wear it.  This will help with clenching and prevent dental injury.  I have given you exercises to perform at home to help relax the muscles of your jaw and aid in pain relief.  You may apply ice to your jaw and facial muscles for 20 minutes at a time 2-3 times a day to help decrease inflammation and aid in pain relief.  Avoid hard or crunchy foods and follow a soft diet until the pain decreases or resolves.  You should avoid chewing ice or gum as this can lead to inflammation of the muscles of your jaw and exacerbate your pain.  If your symptoms not improve I recommend that you follow-up with your dentist for other treatment options.   The 2 lesions on your forehead are most likely insect bites and they will resolve with time.  You may apply topical Benadryl  cream to help with itching.  The Medrol Dosepak I prescribed for your inflame masseter muscle will also help with the itching.  Please go home and take your blood pressure medication to help bring your blood pressure into normal range.

## 2023-07-26 NOTE — ED Triage Notes (Signed)
Pt developed 2 bumps on her forehead 2 days ago and the next day she had swelling around her left ear and jaw area. She also c/o 3 cases of diarrhea last night.

## 2023-07-26 NOTE — ED Provider Notes (Signed)
[facility]    CSN: [id number] Arrival date & time: 07/26/23  0906      History   Chief Complaint Chief Complaint  Patient presents with   Facial Swelling   Diarrhea    HPI Whitney Carey is a 42 y.o. female.   HPI  42 year old female with a past medical history significant for HSV, hypertension, and migraines presents for evaluation of multiple complaints.  She reports that she developed watery diarrhea last evening and she has had a total of 3 episodes but no blood.  She has had nausea but no associated vomiting and no abdominal pain.  Additionally, she developed swelling and pain to the left side of her face that radiates into her left ear.  No associated sore throat or URI symptoms.  She does have 2 small erythematous lesions on her forehead which she is unsure of what caused them.  She does mention that they are tender and they were initially itchy.  Past Medical History:  Diagnosis Date   Genital herpes simplex    Hypertension    Migraine     Patient Active Problem List   Diagnosis Date Noted   Seroma complicating a procedure 05/01/2013   Lipoma of axilla 04/17/2013    History reviewed. No pertinent surgical history.  OB History     Gravida  4   Para  3   Term      Preterm      AB      Living  3      SAB      IAB      Ectopic      Multiple      Live Births           Obstetric Comments  1st Menstrual Cycle:  12  1st Pregnancy:  14          Home Medications    Prior to Admission medications   Medication Sig Start Date End Date Taking? Authorizing Provider  dicyclomine (BENTYL) 20 MG tablet Take 1 tablet (20 mg total) by mouth 2 (two) times daily. 07/26/23  Yes Becky Augusta, NP  hydrochlorothiazide (HYDRODIURIL) 25 MG tablet Take 1 tablet by mouth daily. 04/14/22 07/26/23 Yes [provider]  methylPREDNISolone (MEDROL DOSEPAK) 4 MG TBPK tablet Take according to the package insert. 07/26/23  Yes Becky Augusta, NP   NIFEdipine (PROCARDIA XL/NIFEDICAL-XL) 90 MG 24 hr tablet Take by mouth. 04/14/22 07/26/23 Yes [provider]  olmesartan (BENICAR) 40 MG tablet Take 1 tablet by mouth daily. 08/24/21 07/26/23 Yes [provider]  ondansetron (ZOFRAN-ODT) 8 MG disintegrating tablet Take 1 tablet (8 mg total) by mouth every 8 (eight) hours as needed for nausea or vomiting. 07/26/23  Yes Becky Augusta, NP  ferrous sulfate 325 (65 FE) MG tablet Take by mouth. 02/26/22 06/21/23  [provider]  Multiple Vitamin (MULTI-VITAMIN) tablet Take 1 tablet by mouth daily.    [provider]  valACYclovir (VALTREX) 500 MG tablet Take by mouth. 02/26/22   [provider]  fluticasone (FLONASE) 50 MCG/ACT nasal spray Place 2 sprays into both nostrils daily. 10/08/16 07/15/19  Menshew, Charlesetta Ivory, PA-C  losartan (COZAAR) 25 MG tablet Take 25 mg by mouth daily.  07/15/19  [provider]    Family History Family History  Adopted: Yes  Problem Relation Age of Onset   Healthy Mother     Social History Social History   Tobacco Use   Smoking status: Never  Smokeless tobacco: Never  Vaping Use   Vaping status: Never Used  Substance Use Topics   Alcohol use: Yes    Comment: wine occasionally   Drug use: No     Allergies   Tramadol   Review of Systems Review of Systems  Constitutional:  Negative for fever.  HENT:  Positive for ear pain. Negative for congestion, rhinorrhea and sore throat.   Respiratory:  Negative for cough, shortness of breath and wheezing.   Gastrointestinal:  Positive for diarrhea and nausea. Negative for abdominal pain and vomiting.  Skin:  Positive for color change and wound.     Physical Exam Triage Vital Signs ED Triage Vitals  Encounter Vitals Group     BP      Systolic BP Percentile      Diastolic BP Percentile      Pulse      Resp      Temp      Temp src      SpO2      Weight      Height      Head Circumference       Peak Flow      Pain Score      Pain Loc      Pain Education      Exclude from Growth Chart    No data found.  Updated Vital Signs BP (!) 165/114 Comment: pt has not taken BP medication today  Pulse 73   Temp 98.8 F (37.1 C) (Oral)   Resp 16   LMP 07/12/2023 (Approximate)   SpO2 98%   Visual Acuity Right Eye Distance:   Left Eye Distance:   Bilateral Distance:    Right Eye Near:   Left Eye Near:    Bilateral Near:     Physical Exam Vitals and nursing note reviewed.  Constitutional:      Appearance: Normal appearance. She is not ill-appearing.  HENT:     Head: Normocephalic and atraumatic.     Right Ear: Tympanic membrane, ear canal and external ear normal. There is no impacted cerumen.     Left Ear: Tympanic membrane, ear canal and external ear normal. There is no impacted cerumen.     Mouth/Throat:     Mouth: Mucous membranes are moist.     Pharynx: Oropharynx is clear. No oropharyngeal exudate or posterior oropharyngeal erythema.  Musculoskeletal:     Cervical back: Normal range of motion and neck supple. No tenderness.  Lymphadenopathy:     Cervical: No cervical adenopathy.  Skin:    General: Skin is warm and dry.     Capillary Refill: Capillary refill takes less than 2 seconds.     Findings: Erythema and lesion present.  Neurological:     General: No focal deficit present.     Mental Status: She is alert and oriented to person, place, and time.      UC Treatments / Results  Labs (all labs ordered are listed, but only abnormal results are displayed) Labs Reviewed - No data to display  EKG   Radiology No results found.  Procedures Procedures (including critical care time)  Medications Ordered in UC Medications - No data to display  Initial Impression / Assessment and Plan / UC Course  I have reviewed the triage vital signs and the nursing notes.  Pertinent labs & imaging results that were available during my care of the patient were reviewed  by me and considered in my medical decision making (see  chart for details).   Patient is a pleasant, nontoxic-appearing 42 year old female presenting for evaluation of multiple complaints as outlined HPI above.  Patient's first complaint is these 2 red lesions on her forehead.  They are erythematous and mildly edematous but free of induration, fluctuance, or heat.  They were initially itchy and now they are sore.  These lesions represent mosquito bites.  And there is no evidence of cellulitis.  Patient second complaint is swelling to the left side of her face over her TMJ.  She does have swelling to the masseter muscle on the left side of her face that is tender to palpation.  No popping or clicking with range of motion of the mandible.  The patient reports that she does grind her teeth in her sleep and while the TMJ is not itself tender there is pain in the masseter muscle that radiates into the left ear.  Left ear is unremarkable.  I discussed that she should see her dentist to ask about a nightguard appliance which can help her with grinding.  We will do a low-dose steroid pack to help the patient with her inflammation and pain.  Patient's abdominal complaints consist of 3 episodes of watery diarrhea without any associated blood, abdominal pain, or vomiting.  I suspect that this is most likely a viral GI illness and I will discharge her home with Zofran and Bentyl that she can use every 8 hours needed for nausea and vomiting and the Bentyl is on hand if she develops any abdominal cramping.  We discussed that if she develops any fever, nausea and vomiting where she cannot keep down fluids or medications, or blood in her stool that she needs to be evaluated in the ER.  Patient's blood pressure is elevated in clinic with a bleed pressure 165/114 but she did not take her antihypertensives this morning.  I have encouraged her to go home and take them.  Final Clinical Impressions(s) / UC Diagnoses   Final  diagnoses:  Gastroenteritis  Insect bite of head, unspecified part, initial encounter  TMJ (temporomandibular joint syndrome)  Hypertension, unspecified type     Discharge Instructions      Take the Zofran every 8 hours as needed for nausea and vomiting.  They are an oral disintegrating tablet and you can place them on her under your tongue and then will be absorbed.  Use the Bentyl (dicyclomine) every 6 hours as needed for abdominal cramping.  Follow a clear liquid diet for the next 6 to 12 hours.  Clear liquids consist of broth, ginger ale, water, Pedialyte, and Jell-O.  After 6 to 12 hours, if you are tolerating clear liquids, you can advance to bland foods such as bananas, rice, applesauce, and toast.  If you tolerate bland foods you can continue to advance your diet as you see fit.  If you develop a fever over 100.5, increased abdominal pain, bloody vomit, or bloody stool return for reevaluation or go to the ER.   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  This is inflammation of your temporomandibular joints.  This can cause pain when you chew and may even include pain to your ear.  Take the Medrol Dosepak according to the package instructions.  You will start this tomorrow morning at breakfast and you will take it over period of 6 days decreasing your dose each day.  I recommend that you purchase a nightguard and wear it.  This will help with clenching and prevent dental injury.  I have given you exercises to perform at home to help relax the muscles of your jaw and aid in pain relief.  You may apply ice to your jaw and facial muscles for 20 minutes at a time 2-3 times a day to help decrease inflammation and aid in pain relief.  Avoid hard or crunchy foods and follow a soft diet until the pain decreases or resolves.  You should avoid chewing ice or gum as this can lead to inflammation of the muscles of your jaw and exacerbate your pain.  If your symptoms not improve I  recommend that you follow-up with your dentist for other treatment options.   The 2 lesions on your forehead are most likely insect bites and they will resolve with time.  You may apply topical Benadryl cream to help with itching.  The Medrol Dosepak I prescribed for your inflame masseter muscle will also help with the itching.  Please go home and take your blood pressure medication to help bring your blood pressure into normal range.     ED Prescriptions     Medication Sig Dispense Auth. Provider   methylPREDNISolone (MEDROL DOSEPAK) 4 MG TBPK tablet Take according to the package insert. 1 each Becky Augusta, NP   ondansetron (ZOFRAN-ODT) 8 MG disintegrating tablet Take 1 tablet (8 mg total) by mouth every 8 (eight) hours as needed for nausea or vomiting. 20 tablet Becky Augusta, NP   dicyclomine (BENTYL) 20 MG tablet Take 1 tablet (20 mg total) by mouth 2 (two) times daily. 20 tablet Becky Augusta, NP      PDMP not reviewed this encounter.   Becky Augusta, NP 07/26/23 [phone]

## 2024-04-18 ENCOUNTER — Ambulatory Visit: Attending: Otolaryngology

## 2024-04-18 DIAGNOSIS — G4733 Obstructive sleep apnea (adult) (pediatric): Secondary | ICD-10-CM | POA: Diagnosis present

## 2024-04-18 DIAGNOSIS — I10 Essential (primary) hypertension: Secondary | ICD-10-CM | POA: Insufficient documentation

## 2024-07-31 ENCOUNTER — Ambulatory Visit: Attending: Otolaryngology

## 2024-07-31 DIAGNOSIS — G4733 Obstructive sleep apnea (adult) (pediatric): Secondary | ICD-10-CM | POA: Diagnosis present

## 2024-07-31 DIAGNOSIS — I10 Essential (primary) hypertension: Secondary | ICD-10-CM | POA: Diagnosis not present
